# Patient Record
Sex: Female | Born: 1990 | Race: Black or African American | Hispanic: No | Marital: Married | Smoking: Former smoker
Health system: Southern US, Community
[De-identification: ages and names within clinical notes are randomized; demographics above are authoritative.]

## PROBLEM LIST (undated history)

## (undated) ENCOUNTER — Inpatient Hospital Stay (HOSPITAL_COMMUNITY): Payer: Self-pay

## (undated) DIAGNOSIS — D649 Anemia, unspecified: Secondary | ICD-10-CM

## (undated) HISTORY — PX: NO PAST SURGERIES: SHX2092

---

## 2011-03-08 ENCOUNTER — Emergency Department (HOSPITAL_COMMUNITY)
Admission: EM | Admit: 2011-03-08 | Discharge: 2011-03-08 | Disposition: A | Discharge status: Home / Self Care | Payer: Self-pay | Attending: Emergency Medicine | Admitting: Emergency Medicine

## 2011-03-08 DIAGNOSIS — K5900 Constipation, unspecified: Secondary | ICD-10-CM | POA: Insufficient documentation

## 2012-08-15 ENCOUNTER — Encounter (HOSPITAL_COMMUNITY): Payer: Self-pay | Admitting: Emergency Medicine

## 2012-08-15 ENCOUNTER — Emergency Department (INDEPENDENT_AMBULATORY_CARE_PROVIDER_SITE_OTHER)
Admission: EM | Admit: 2012-08-15 | Discharge: 2012-08-15 | Disposition: A | Discharge status: Home / Self Care | Payer: PRIVATE HEALTH INSURANCE | Source: Home / Self Care | Attending: Family Medicine | Admitting: Family Medicine

## 2012-08-15 DIAGNOSIS — N390 Urinary tract infection, site not specified: Secondary | ICD-10-CM

## 2012-08-15 LAB — POCT URINALYSIS DIP (DEVICE)
Glucose, UA: NEGATIVE mg/dL
Hgb urine dipstick: NEGATIVE
Ketones, ur: NEGATIVE mg/dL
Specific Gravity, Urine: 1.015 (ref 1.005–1.030)

## 2012-08-15 LAB — POCT PREGNANCY, URINE: Preg Test, Ur: NEGATIVE

## 2012-08-15 MED ORDER — CEPHALEXIN 500 MG PO CAPS: 500.0000 mg | ORAL_CAPSULE | Freq: Four times a day (QID) | ORAL | Status: DC

## 2012-08-15 NOTE — ED Provider Notes (Signed)
History     CSN: [id number]  Arrival date & time 08/15/12  4098   First MD Initiated Contact with Patient 08/15/12 1900      Chief Complaint  Patient presents with  . Urinary Tract Infection    urgency. frequency. blood in urine. pelvic pain and cramping.     (Consider location/radiation/quality/duration/timing/severity/associated sxs/prior treatment) Patient is a 22 y.o. female presenting with urinary tract infection. The history is provided by the patient.  Urinary Tract Infection This is a new problem. The current episode started more than 2 days ago. The problem has been gradually worsening. Pertinent negatives include no abdominal pain.    History reviewed. No pertinent past medical history.  History reviewed. No pertinent past surgical history.  History reviewed. No pertinent family history.  History  Substance Use Topics  . Smoking status: Never Smoker   . Smokeless tobacco: Not on file  . Alcohol Use: No    OB History    Grav Para Term Preterm Abortions TAB SAB Ect Mult Living                  Review of Systems  Constitutional: Negative.   Gastrointestinal: Negative.  Negative for abdominal pain.  Genitourinary: Positive for dysuria, urgency and frequency. Negative for vaginal bleeding, vaginal discharge and vaginal pain.    Allergies  Review of patient's allergies indicates no known allergies.  Home Medications   Current Outpatient Rx  Name  Route  Sig  Dispense  Refill  . CEPHALEXIN 500 MG PO CAPS   Oral   Take 1 capsule (500 mg total) by mouth 4 (four) times daily. Take all of medicine and drink lots of fluids   20 capsule   0     BP 124/65  Pulse 69  Temp 98.1 F (36.7 C) (Oral)  Resp 18  SpO2 100%  LMP 08/04/2012  Physical Exam  Nursing note and vitals reviewed. Constitutional: She is oriented to person, place, and time. She appears well-developed and well-nourished.  Abdominal: Soft. Bowel sounds are normal. She exhibits no  distension and no mass. There is no tenderness. There is no rebound and no guarding.  Neurological: She is alert and oriented to person, place, and time.  Skin: Skin is warm and dry.    ED Course  Procedures (including critical care time)  Labs Reviewed  POCT URINALYSIS DIP (DEVICE) - Abnormal; Notable for the following:    Protein, ur 30 (*)     Leukocytes, UA TRACE (*)  Biochemical Testing Only. Please order routine urinalysis from main lab if confirmatory testing is needed.   All other components within normal limits  POCT PREGNANCY, URINE   No results found.   1. UTI (lower urinary tract infection)       MDM  U/a-- pos        Linna Hoff, MD 08/15/12 2027

## 2012-08-15 NOTE — ED Notes (Signed)
Pt c/o uti symptoms. Blood in urine. Urgency. Frequency. Pelvic pain and cramping with nausea. Symptoms x 4 days.

## 2014-08-08 NOTE — L&D Delivery Note (Signed)
Delivery Note  Patient was admitted at 10569w0d for PDIOL. She was induced with FB, cytotec, pitocin and had AROM at 0640 on 06/21/15. Labor was significant for a prolonged second stage. She was complete at 1800 but began consistently pushing around 2100.   At 11:34 PM a viable and healthy female was delivered via Vaginal, Spontaneous Delivery (Presentation: Right Occiput Anterior).  APGAR: 8, 9; weight 8 lb 15 oz (4054 g).     Placenta status: Intact, Spontaneous.  Cord: 3 vessels with the following complications: Loose nuchal.  Cord pH: N/A  Anesthesia: Epidural  Episiotomy: None Lacerations: 2nd degree Suture Repair: 2.0 vicryl rapide Est. Blood Loss (mL): 200  Mom to postpartum.  Baby to Couplet care / Skin to Skin.  Jamelle HaringHillary M Fitzgerald, MD [facility], PGY-1 06/21/2015, 11:55 PM

## 2014-11-04 ENCOUNTER — Inpatient Hospital Stay (HOSPITAL_COMMUNITY)
Admission: AD | Admit: 2014-11-04 | Discharge: 2014-11-04 | Disposition: A | Discharge status: Home / Self Care | Payer: Medicaid Other | Source: Ambulatory Visit | Attending: Family Medicine | Admitting: Family Medicine

## 2014-11-04 ENCOUNTER — Encounter (HOSPITAL_COMMUNITY): Payer: Self-pay | Admitting: *Deleted

## 2014-11-04 ENCOUNTER — Inpatient Hospital Stay (HOSPITAL_COMMUNITY): Payer: PRIVATE HEALTH INSURANCE

## 2014-11-04 DIAGNOSIS — Z3A08 8 weeks gestation of pregnancy: Secondary | ICD-10-CM | POA: Insufficient documentation

## 2014-11-04 DIAGNOSIS — O26899 Other specified pregnancy related conditions, unspecified trimester: Secondary | ICD-10-CM

## 2014-11-04 DIAGNOSIS — N8320 Unspecified ovarian cysts: Secondary | ICD-10-CM | POA: Diagnosis not present

## 2014-11-04 DIAGNOSIS — O9989 Other specified diseases and conditions complicating pregnancy, childbirth and the puerperium: Secondary | ICD-10-CM | POA: Insufficient documentation

## 2014-11-04 DIAGNOSIS — O3481 Maternal care for other abnormalities of pelvic organs, first trimester: Secondary | ICD-10-CM | POA: Insufficient documentation

## 2014-11-04 DIAGNOSIS — N83209 Unspecified ovarian cyst, unspecified side: Secondary | ICD-10-CM

## 2014-11-04 DIAGNOSIS — R109 Unspecified abdominal pain: Secondary | ICD-10-CM | POA: Insufficient documentation

## 2014-11-04 HISTORY — DX: Anemia, unspecified: D64.9

## 2014-11-04 LAB — URINALYSIS, ROUTINE W REFLEX MICROSCOPIC
BILIRUBIN URINE: NEGATIVE
Glucose, UA: NEGATIVE mg/dL
HGB URINE DIPSTICK: NEGATIVE
Ketones, ur: NEGATIVE mg/dL
LEUKOCYTES UA: NEGATIVE
Nitrite: NEGATIVE
PH: 8.5 — AB (ref 5.0–8.0)
Protein, ur: NEGATIVE mg/dL
SPECIFIC GRAVITY, URINE: 1.015 (ref 1.005–1.030)
UROBILINOGEN UA: 0.2 mg/dL (ref 0.0–1.0)

## 2014-11-04 LAB — CBC
HEMATOCRIT: 35.4 % — AB (ref 36.0–46.0)
HEMOGLOBIN: 12.2 g/dL (ref 12.0–15.0)
MCH: 30.6 pg (ref 26.0–34.0)
MCHC: 34.5 g/dL (ref 30.0–36.0)
MCV: 88.7 fL (ref 78.0–100.0)
Platelets: 299 10*3/uL (ref 150–400)
RBC: 3.99 MIL/uL (ref 3.87–5.11)
RDW: 12.3 % (ref 11.5–15.5)
WBC: 6 10*3/uL (ref 4.0–10.5)

## 2014-11-04 LAB — WET PREP, GENITAL
Trich, Wet Prep: NONE SEEN
YEAST WET PREP: NONE SEEN

## 2014-11-04 LAB — ABO/RH: ABO/RH(D): O POS

## 2014-11-04 LAB — HCG, QUANTITATIVE, PREGNANCY: HCG, BETA CHAIN, QUANT, S: 71782 m[IU]/mL — AB (ref <5)

## 2014-11-04 LAB — POCT PREGNANCY, URINE: PREG TEST UR: POSITIVE — AB

## 2014-11-04 MED ORDER — CONCEPT OB 130-92.4-1 MG PO CAPS: 1.0000 | ORAL_CAPSULE | Freq: Every day | ORAL | Status: AC

## 2014-11-04 NOTE — MAU Note (Signed)
C/o cramping since 2100 last night; denies bleeding; positive UPT today in MAU;

## 2014-11-04 NOTE — MAU Note (Signed)
C/o abdominal pain yesterday but not today; denies any bleeding or vaginal leaking; hx of TAB;

## 2014-11-04 NOTE — Discharge Instructions (Signed)
Ovarian Cyst An ovarian cyst is a fluid-filled sac that forms on an ovary. The ovaries are small organs that produce eggs in women. Various types of cysts can form on the ovaries. Most are not cancerous. Many do not cause problems, and they often go away on their own. Some may cause symptoms and require treatment. Common types of ovarian cysts include:  Functional cysts--These cysts may occur every month during the menstrual cycle. This is normal. The cysts usually go away with the next menstrual cycle if the woman does not get pregnant. Usually, there are no symptoms with a functional cyst.  Endometrioma cysts--These cysts form from the tissue that lines the uterus. They are also called "chocolate cysts" because they become filled with blood that turns brown. This type of cyst can cause pain in the lower abdomen during intercourse and with your menstrual period.  Cystadenoma cysts--This type develops from the cells on the outside of the ovary. These cysts can get very big and cause lower abdomen pain and pain with intercourse. This type of cyst can twist on itself, cut off its blood supply, and cause severe pain. It can also easily rupture and cause a lot of pain.  Dermoid cysts--This type of cyst is sometimes found in both ovaries. These cysts may contain different kinds of body tissue, such as skin, teeth, hair, or cartilage. They usually do not cause symptoms unless they get very big.  Theca lutein cysts--These cysts occur when too much of a certain hormone (human chorionic gonadotropin) is produced and overstimulates the ovaries to produce an egg. This is most common after procedures used to assist with the conception of a baby (in vitro fertilization). CAUSES   Fertility drugs can cause a condition in which multiple large cysts are formed on the ovaries. This is called ovarian hyperstimulation syndrome.  A condition called polycystic ovary syndrome can cause hormonal imbalances that can lead to  nonfunctional ovarian cysts. SIGNS AND SYMPTOMS  Many ovarian cysts do not cause symptoms. If symptoms are present, they may include:  Pelvic pain or pressure.  Pain in the lower abdomen.  Pain during sexual intercourse.  Increasing girth (swelling) of the abdomen.  Abnormal menstrual periods.  Increasing pain with menstrual periods.  Stopping having menstrual periods without being pregnant. DIAGNOSIS  These cysts are commonly found during a routine or annual pelvic exam. Tests may be ordered to find out more about the cyst. These tests may include:  Ultrasound.  X-ray of the pelvis.  CT scan.  MRI.  Blood tests. TREATMENT  Many ovarian cysts go away on their own without treatment. Your health care provider may want to check your cyst regularly for 2-3 months to see if it changes. For women in menopause, it is particularly important to monitor a cyst closely because of the higher rate of ovarian cancer in menopausal women. When treatment is needed, it may include any of the following:  A procedure to drain the cyst (aspiration). This may be done using a long needle and ultrasound. It can also be done through a laparoscopic procedure. This involves using a thin, lighted tube with a tiny camera on the end (laparoscope) inserted through a small incision.  Surgery to remove the whole cyst. This may be done using laparoscopic surgery or an open surgery involving a larger incision in the lower abdomen.  Hormone treatment or birth control pills. These methods are sometimes used to help dissolve a cyst. HOME CARE INSTRUCTIONS   Only take over-the-counter  or prescription medicines as directed by your health care provider.  Follow up with your health care provider as directed.  Get regular pelvic exams and Pap tests. SEEK MEDICAL CARE IF:   Your periods are late, irregular, or painful, or they stop.  Your pelvic pain or abdominal pain does not go away.  Your abdomen becomes  larger or swollen.  You have pressure on your bladder or trouble emptying your bladder completely.  You have pain during sexual intercourse.  You have feelings of fullness, pressure, or discomfort in your stomach.  You lose weight for no apparent reason.  You feel generally ill.  You become constipated.  You lose your appetite.  You develop acne.  You have an increase in body and facial hair.  You are gaining weight, without changing your exercise and eating habits.  You think you are pregnant. SEEK IMMEDIATE MEDICAL CARE IF:   You have increasing abdominal pain. This may be a sign of ovarian torsion (twisting of the ovary).  You feel sick to your stomach (nauseous), and you throw up (vomit).  You develop a fever that comes on suddenly.  You have abdominal pain during a bowel movement.  Your menstrual periods become heavier than usual. MAKE SURE YOU:  Understand these instructions.  Will watch your condition.  Will get help right away if you are not doing well or get worse. Document Released: 07/25/2005 Document Revised: 07/30/2013 Document Reviewed: 04/01/2013 [facility] Patient Information 2015 [place], [place]. This information is not intended to replace advice given to you by your health care provider. Make sure you discuss any questions you have with your health care provider.   Ovarian Torsion The ovaries are female reproductive organs that produce eggs. Ovarian torsion is when an ovary becomes twisted and cuts off its own blood supply. This can occur at any age. If an ovary is twisted, it cannot get blood and the ovary swells. It is a painful medical emergency. It must be treated quickly. If too much time has passed, blood flow to the ovary may not be restored and the ovary may have to be removed. CAUSES Torsion can happen in an ovary that is normal size. However, most of the time it occurs in an ovary that is enlarged. An ovary can become enlarged because  of:  Harmless (benign) tumors on the ovaries.  Cancerous tumors.  Ovarian cysts, which are fluid-filled sacs.  Normal pregnancy.  A pregnancy that occurs outside the uterus (ectopic pregnancy). RISK FACTORS Risk factors are things that increase the likelihood of this condition happening. The risk factors include:  Having fallopian tubes that are longer than normal.  Having ovaries that are larger than normal.  Taking fertility medicine to become pregnant.  Having had surgery in the pelvic area. SYMPTOMS  Sudden pain in the lower abdomen, usually on one side only.  Pelvic pain that starts after exercise.  Pelvic pain that gets worse over time.  Severe pelvic pain that comes and goes.  Pelvic pain that spreads into the lower back or thigh.  Nausea and vomiting along with pelvic pain. DIAGNOSIS Your caregiver will take a history and perform a physical exam. He or she may be able to feel an enlarged ovary. Your caregiver may order some further tests, which include:  A pregnancy test.  Imaging tests, such as pelvic Doppler ultrasonography, that measures blood flow, CT scan, or MRI. Your caregiver may also perform a diagnostic laparoscopic exam. A small surgical cut (incision) will be made  in your abdomen. Then, a small lighted telescope is put through the opening. This allows your caregiver to clearly see your ovary and fallopian tube.  TREATMENT Surgery is needed when an ovary becomes twisted. It is best to do this 8 hours or less after the ovary becomes twisted. Laparoscopic ovarian torsion surgery is done to try to untwist the ovary. Sometimes, a large incision has to be made in the abdomen (laparotomy) to relieve the ovary. If the ovary cannot be untwisted, the ovary will have to be surgically removed during a procedure called salpingo-oophorectomy. Document Released: 07/14/2011 Document Revised: 12/09/2013 Document Reviewed: 07/14/2011 [facility] Patient Information 2015  [place], [place]. This information is not intended to replace advice given to you by your health care provider. Make sure you discuss any questions you have with your health care provider.  First Trimester of Pregnancy The first trimester of pregnancy is from week 1 until the end of week 12 (months 1 through 3). A week after a sperm fertilizes an egg, the egg will implant on the wall of the uterus. This embryo will begin to develop into a baby. Genes from you and your partner are forming the baby. The female genes determine whether the baby is a boy or a girl. At 6-8 weeks, the eyes and face are formed, and the heartbeat can be seen on ultrasound. At the end of 12 weeks, all the baby's organs are formed.  Now that you are pregnant, you will want to do everything you can to have a healthy baby. Two of the most important things are to get good prenatal care and to follow your health care provider's instructions. Prenatal care is all the medical care you receive before the baby's birth. This care will help prevent, find, and treat any problems during the pregnancy and childbirth. BODY CHANGES Your body goes through many changes during pregnancy. The changes vary from woman to woman.   You may gain or lose a couple of pounds at first.  You may feel sick to your stomach (nauseous) and throw up (vomit). If the vomiting is uncontrollable, call your health care provider.  You may tire easily.  You may develop headaches that can be relieved by medicines approved by your health care provider.  You may urinate more often. Painful urination may mean you have a bladder infection.  You may develop heartburn as a result of your pregnancy.  You may develop constipation because certain hormones are causing the muscles that push waste through your intestines to slow down.  You may develop hemorrhoids or swollen, bulging veins (varicose veins).  Your breasts may begin to grow larger and become tender. Your nipples  may stick out more, and the tissue that surrounds them (areola) may become darker.  Your gums may bleed and may be sensitive to brushing and flossing.  Dark spots or blotches (chloasma, mask of pregnancy) may develop on your face. This will likely fade after the baby is born.  Your menstrual periods will stop.  You may have a loss of appetite.  You may develop cravings for certain kinds of food.  You may have changes in your emotions from day to day, such as being excited to be pregnant or being concerned that something may go wrong with the pregnancy and baby.  You may have more vivid and strange dreams.  You may have changes in your hair. These can include thickening of your hair, rapid growth, and changes in texture. Some women also have hair  loss during or after pregnancy, or hair that feels dry or thin. Your hair will most likely return to normal after your baby is born. WHAT TO EXPECT AT YOUR PRENATAL VISITS During a routine prenatal visit:  You will be weighed to make sure you and the baby are growing normally.  Your blood pressure will be taken.  Your abdomen will be measured to track your baby's growth.  The fetal heartbeat will be listened to starting around week 10 or 12 of your pregnancy.  Test results from any previous visits will be discussed. Your health care provider may ask you:  How you are feeling.  If you are feeling the baby move.  If you have had any abnormal symptoms, such as leaking fluid, bleeding, severe headaches, or abdominal cramping.  If you have any questions. Other tests that may be performed during your first trimester include:  Blood tests to find your blood type and to check for the presence of any previous infections. They will also be used to check for low iron levels (anemia) and Rh antibodies. Later in the pregnancy, blood tests for diabetes will be done along with other tests if problems develop.  Urine tests to check for infections,  diabetes, or protein in the urine.  An ultrasound to confirm the proper growth and development of the baby.  An amniocentesis to check for possible genetic problems.  Fetal screens for spina bifida and Down syndrome.  You may need other tests to make sure you and the baby are doing well. HOME CARE INSTRUCTIONS  Medicines  Follow your health care provider's instructions regarding medicine use. Specific medicines may be either safe or unsafe to take during pregnancy.  Take your prenatal vitamins as directed.  If you develop constipation, try taking a stool softener if your health care provider approves. Diet  Eat regular, well-balanced meals. Choose a variety of foods, such as meat or vegetable-based protein, fish, milk and low-fat dairy products, vegetables, fruits, and whole grain breads and cereals. Your health care provider will help you determine the amount of weight gain that is right for you.  Avoid raw meat and uncooked cheese. These carry germs that can cause birth defects in the baby.  Eating four or five small meals rather than three large meals a day may help relieve nausea and vomiting. If you start to feel nauseous, eating a few soda crackers can be helpful. Drinking liquids between meals instead of during meals also seems to help nausea and vomiting.  If you develop constipation, eat more high-fiber foods, such as fresh vegetables or fruit and whole grains. Drink enough fluids to keep your urine clear or pale yellow. Activity and Exercise  Exercise only as directed by your health care provider. Exercising will help you:  Control your weight.  Stay in shape.  Be prepared for labor and delivery.  Experiencing pain or cramping in the lower abdomen or low back is a good sign that you should stop exercising. Check with your health care provider before continuing normal exercises.  Try to avoid standing for long periods of time. Move your legs often if you must stand in  one place for a long time.  Avoid heavy lifting.  Wear low-heeled shoes, and practice good posture.  You may continue to have sex unless your health care provider directs you otherwise. Relief of Pain or Discomfort  Wear a good support bra for breast tenderness.   Take warm sitz baths to soothe any pain or  discomfort caused by hemorrhoids. Use hemorrhoid cream if your health care provider approves.   Rest with your legs elevated if you have leg cramps or low back pain.  If you develop varicose veins in your legs, wear support hose. Elevate your feet for 15 minutes, 3-4 times a day. Limit salt in your diet. Prenatal Care  Schedule your prenatal visits by the twelfth week of pregnancy. They are usually scheduled monthly at first, then more often in the last 2 months before delivery.  Write down your questions. Take them to your prenatal visits.  Keep all your prenatal visits as directed by your health care provider. Safety  Wear your seat belt at all times when driving.  Make a list of emergency phone numbers, including numbers for family, friends, the hospital, and police and fire departments. General Tips  Ask your health care provider for a referral to a local prenatal education class. Begin classes no later than at the beginning of month 6 of your pregnancy.  Ask for help if you have counseling or nutritional needs during pregnancy. Your health care provider can offer advice or refer you to specialists for help with various needs.  Do not use hot tubs, steam rooms, or saunas.  Do not douche or use tampons or scented sanitary pads.  Do not cross your legs for long periods of time.  Avoid cat litter boxes and soil used by cats. These carry germs that can cause birth defects in the baby and possibly loss of the fetus by miscarriage or stillbirth.  Avoid all smoking, herbs, alcohol, and medicines not prescribed by your health care provider. Chemicals in these affect the  formation and growth of the baby.  Schedule a dentist appointment. At home, brush your teeth with a soft toothbrush and be gentle when you floss. SEEK MEDICAL CARE IF:   You have dizziness.  You have mild pelvic cramps, pelvic pressure, or nagging pain in the abdominal area.  You have persistent nausea, vomiting, or diarrhea.  You have a bad smelling vaginal discharge.  You have pain with urination.  You notice increased swelling in your face, hands, legs, or ankles. SEEK IMMEDIATE MEDICAL CARE IF:   You have a fever.  You are leaking fluid from your vagina.  You have spotting or bleeding from your vagina.  You have severe abdominal cramping or pain.  You have rapid weight gain or loss.  You vomit blood or material that looks like coffee grounds.  You are exposed to [place] measles and have never had them.  You are exposed to fifth disease or chickenpox.  You develop a severe headache.  You have shortness of breath.  You have any kind of trauma, such as from a fall or a car accident. Document Released: 07/19/2001 Document Revised: 12/09/2013 Document Reviewed: 06/04/2013 [facility] Patient Information 2015 [place], [place]. This information is not intended to replace advice given to you by your health care provider. Make sure you discuss any questions you have with your health care provider.

## 2014-11-04 NOTE — MAU Note (Signed)
Having abd cramps since last night. No bleeding or d/c. No cramping today.

## 2014-11-04 NOTE — MAU Provider Note (Deleted)
Chief Complaint: Abdominal Cramping   None    SUBJECTIVE HPI: Debra Mccarty is a 24 y.o. G2P0010 at 572w4d by LMP who presents to Maternity Admissions reporting   C/o cramping since 2100 last night; denies bleeding; positive UPT today in MAU  Past Medical History  Diagnosis Date  . Anemia    OB History  Gravida Para Term Preterm AB SAB TAB Ectopic Multiple Living  2    1  1        # Outcome Date GA Lbr Len/2nd Weight Sex Delivery Anes PTL Lv  2 Current           1 TAB              Past Surgical History  Procedure Laterality Date  . No past surgeries     History   Social History  . Marital Status: Single    Spouse Name: N/A  . Number of Children: N/A  . Years of Education: N/A   Occupational History  . Not on file.   Social History Main Topics  . Smoking status: Never Smoker   . Smokeless tobacco: Not on file  . Alcohol Use: No  . Drug Use: No  . Sexual Activity: Yes    Birth Control/ Protection: None   Other Topics Concern  . Not on file   Social History Narrative   No current facility-administered medications on file prior to encounter.   Current Outpatient Prescriptions on File Prior to Encounter  Medication Sig Dispense Refill  . cephALEXin (KEFLEX) 500 MG capsule Take 1 capsule (500 mg total) by mouth 4 (four) times daily. Take all of medicine and drink lots of fluids 20 capsule 0   No Known Allergies  ROS  OBJECTIVE Blood pressure 132/61, pulse 82, temperature 98.7 F (37.1 C), resp. rate 18, height 5\' 8"  (1.727 m), weight 178 lb 9.6 oz (81.012 kg), last menstrual period 09/05/2014, SpO2 100 %. GENERAL: Well-developed, well-nourished female in no acute distress.  HEART: normal rate RESP: normal effort GI: Abdomen soft, non-tender. Positive bowel sounds 4. MS: Nontender, no edema NEURO: Alert and oriented SPECULUM EXAM: NEFG, physiologic discharge, no blood noted, cervix clean BIMANUAL: cervix    ; uterus normal size, no adnexal tenderness or  masses  LAB RESULTS Results for orders placed or performed during the hospital encounter of 11/04/14 (from the past 24 hour(s))  Pregnancy, urine POC     Status: Abnormal   Collection Time: 11/04/14 12:59 PM  Result Value Ref Range   Preg Test, Ur POSITIVE (A) NEGATIVE    IMAGING No results found.  MAU COURSE  ASSESSMENT No diagnosis found.  PLAN Discharge home in stable condition.    Medication List    ASK your doctor about these medications        cephALEXin 500 MG capsule  Commonly known as:  KEFLEX  Take 1 capsule (500 mg total) by mouth 4 (four) times daily. Take all of medicine and drink lots of fluids         [place] Robey Massmann, [place] 11/04/2014  1:11 PM

## 2014-11-04 NOTE — MAU Provider Note (Signed)
History     CSN: [id number]  Arrival date and time: 11/04/14 1228   First Provider Initiated Contact with Patient 11/04/14 1315      Chief Complaint  Patient presents with  . Abdominal Cramping   HPI  Debra Mccarty is a 24 y.o.BF G2P0010, [redacted]w[redacted]d, with history of elective abortion 1 year ago. Pt states she had some pelvic cramping last night that lasted for about an hour. The cramping began as a severe 10/10 pain and alleviated over time. She did not try any treatment. She experienced cramping 3 weeks ago that only lasted for a few minutes, but prompted her to get an OTC UPT which was positive. LMP was 09/05/14. She is currently asymptomatic but presents because she is concerned that the cramping is abnormal. No VB, vaginal d/c or LOF. She has not had any prenatal care yet.  OB History    Gravida Para Term Preterm AB TAB SAB Ectopic Multiple Living   2    1 1           Past Medical History  Diagnosis Date  . Anemia     Past Surgical History  Procedure Laterality Date  . No past surgeries      Family History  Problem Relation Age of Onset  . Hypertension Maternal Grandmother   . Cancer Maternal Grandmother   . Arthritis Paternal Grandmother     History  Substance Use Topics  . Smoking status: Never Smoker   . Smokeless tobacco: Not on file  . Alcohol Use: No    Allergies: No Known Allergies  Prescriptions prior to admission  Medication Sig Dispense Refill Last Dose  . cephALEXin (KEFLEX) 500 MG capsule Take 1 capsule (500 mg total) by mouth 4 (four) times daily. Take all of medicine and drink lots of fluids 20 capsule 0     Review of Systems  Constitutional: Negative for fever, chills, weight loss and malaise/fatigue.  HENT: Negative.   Eyes: Negative.   Respiratory: Negative for cough, shortness of breath and wheezing.   Cardiovascular: Negative for chest pain and palpitations.  Gastrointestinal: Positive for nausea. Negative for vomiting, abdominal pain,  diarrhea, constipation and blood in stool.       Occasional nausea in the mornings.  Genitourinary: Negative for dysuria, urgency, frequency and hematuria.  Musculoskeletal: Negative.   Skin: Negative.   Neurological: Negative.  Negative for dizziness, tingling, loss of consciousness, weakness and headaches.  Endo/Heme/Allergies: Negative.   Psychiatric/Behavioral: Negative.    Physical Exam   Blood pressure 132/61, pulse 82, temperature 98.7 F (37.1 C), resp. rate 18, height 5\' 8"  (1.727 m), weight 81.012 kg (178 lb 9.6 oz), last menstrual period 09/05/2014, SpO2 100 %.  Physical Exam  Nursing note and vitals reviewed. Constitutional: She is oriented to person, place, and time. She appears well-developed and well-nourished. No distress.  HENT:  Head: Normocephalic and atraumatic.  Eyes: Conjunctivae and EOM are normal. Pupils are equal, round, and reactive to light.  Neck: Normal range of motion. Neck supple.  Cardiovascular: Normal rate, regular rhythm, normal heart sounds and intact distal pulses.   Respiratory: Effort normal and breath sounds normal.  GI: Soft. Bowel sounds are normal. She exhibits no distension and no mass. There is no tenderness. There is no rebound and no guarding. Hernia confirmed negative in the right inguinal area and confirmed negative in the left inguinal area.  Genitourinary: There is no rash, tenderness, lesion or injury on the right labia. There is no rash, tenderness,  lesion or injury on the left labia. Uterus is enlarged. Cervix exhibits no motion tenderness. Right adnexum displays no mass, no tenderness and no fullness. Left adnexum displays no mass, no tenderness and no fullness. No erythema, tenderness or bleeding in the vagina. No foreign body around the vagina. No signs of injury around the vagina. Vaginal discharge found.  Moderate thick white vaginal d/c Cervix is open and mildly erythematous  Musculoskeletal: Normal range of motion. She exhibits  no edema or tenderness.  Lymphadenopathy:    She has no cervical adenopathy.  Neurological: She is alert and oriented to person, place, and time.  Skin: Skin is warm and dry. She is not diaphoretic.  Psychiatric: She has a normal mood and affect. Her behavior is normal.  Bimanual exam: Cervix closed. No CMT. No adnexal tenderness.   Results for orders placed or performed during the hospital encounter of 11/04/14 (from the past 24 hour(s))  Urinalysis, Routine w reflex microscopic     Status: Abnormal   Collection Time: 11/04/14 12:45 PM  Result Value Ref Range   Color, Urine YELLOW YELLOW   APPearance CLEAR CLEAR   Specific Gravity, Urine 1.015 1.005 - 1.030   pH 8.5 (H) 5.0 - 8.0   Glucose, UA NEGATIVE NEGATIVE mg/dL   Hgb urine dipstick NEGATIVE NEGATIVE   Bilirubin Urine NEGATIVE NEGATIVE   Ketones, ur NEGATIVE NEGATIVE mg/dL   Protein, ur NEGATIVE NEGATIVE mg/dL   Urobilinogen, UA 0.2 0.0 - 1.0 mg/dL   Nitrite NEGATIVE NEGATIVE   Leukocytes, UA NEGATIVE NEGATIVE  Pregnancy, urine POC     Status: Abnormal   Collection Time: 11/04/14 12:59 PM  Result Value Ref Range   Preg Test, Ur POSITIVE (A) NEGATIVE  Wet prep, genital     Status: Abnormal   Collection Time: 11/04/14  1:50 PM  Result Value Ref Range   Yeast Wet Prep HPF POC NONE SEEN NONE SEEN   Trich, Wet Prep NONE SEEN NONE SEEN   Clue Cells Wet Prep HPF POC FEW (A) NONE SEEN   WBC, Wet Prep HPF POC FEW (A) NONE SEEN  ABO/Rh     Status: None   Collection Time: 11/04/14  2:03 PM  Result Value Ref Range   ABO/RH(D) O POS   CBC     Status: Abnormal   Collection Time: 11/04/14  2:03 PM  Result Value Ref Range   WBC 6.0 4.0 - 10.5 K/uL   RBC 3.99 3.87 - 5.11 MIL/uL   Hemoglobin 12.2 12.0 - 15.0 g/dL   HCT 08.6 (L) 57.8 - 46.9 %   MCV 88.7 78.0 - 100.0 fL   MCH 30.6 26.0 - 34.0 pg   MCHC 34.5 30.0 - 36.0 g/dL   RDW 62.9 52.8 - 41.3 %   Platelets 299 150 - 400 K/uL  hCG, quantitative, pregnancy     Status: Abnormal    Collection Time: 11/04/14  2:03 PM  Result Value Ref Range   hCG, Beta Chain, Quant, S 71782 (H) <5 mIU/mL   IMAGING US Ob Comp Less 14 Wks  11/04/2014   CLINICAL DATA:  Patient with abdominal cramping. Unsure of last menstrual period.  EXAM: OBSTETRIC <14 WK [place] AND TRANSVAGINAL OB US  TECHNIQUE: Both transabdominal and transvaginal ultrasound examinations were performed for complete evaluation of the gestation as well as the maternal uterus, adnexal regions, and pelvic cul-de-sac. Transvaginal technique was performed to assess early pregnancy.  COMPARISON:  None.  FINDINGS: Intrauterine gestational sac: Visualized/normal in shape.  Yolk sac:  Present  Embryo:  Present  Cardiac Activity: Present  Heart Rate: 156  bpm  CRL:  16.8  mm   8 w   1 d                  US EDC: 06/15/2015  Maternal uterus/adnexae: The right ovary is normal. The left ovary is enlarged measuring 7.4 x 5.9 x 7.3 cm. Multiple thin internal septations versus retracting clot demonstrated within the left ovarian cyst which measures 7.7 x 6.9 x 3.4 cm. Moderate subchorionic hemorrhage. Trace free fluid in the pelvis.  IMPRESSION: Single live intrauterine gestation 8 weeks 4 days by LMP.  Large complicated cystic mass within the left ovary with multiple internal echoes/ thin septations, favored to represent a hemorrhagic cyst. Short-term sonographic follow-up in 6-8 weeks is recommended to evaluate for interval change/resolution.  While felt to be less likely, given the size of the left ovary, intermittent torsion cannot be excluded as a cause of pain.  Moderate subchorionic hemorrhage.  These results will be called to the ordering clinician or representative by the Radiologist Assistant, and communication documented in the PACS or zVision Dashboard.   Electronically Signed   By: Annia Beltrew  Davis M.D.   On: 11/04/2014 15:22   [place] Ob Transvaginal  11/04/2014   CLINICAL DATA:  Patient with abdominal cramping. Unsure of last menstrual period.   EXAM: OBSTETRIC <14 WK US AND TRANSVAGINAL OB US  TECHNIQUE: Both transabdominal and transvaginal ultrasound examinations were performed for complete evaluation of the gestation as well as the maternal uterus, adnexal regions, and pelvic cul-de-sac. Transvaginal technique was performed to assess early pregnancy.  COMPARISON:  None.  FINDINGS: Intrauterine gestational sac: Visualized/normal in shape.  Yolk sac:  Present  Embryo:  Present  Cardiac Activity: Present  Heart Rate: 156  bpm  CRL:  16.8  mm   8 w   1 d                  US EDC: 06/15/2015  Maternal uterus/adnexae: The right ovary is normal. The left ovary is enlarged measuring 7.4 x 5.9 x 7.3 cm. Multiple thin internal septations versus retracting clot demonstrated within the left ovarian cyst which measures 7.7 x 6.9 x 3.4 cm. Moderate subchorionic hemorrhage. Trace free fluid in the pelvis.  IMPRESSION: Single live intrauterine gestation 8 weeks 4 days by LMP.  Large complicated cystic mass within the left ovary with multiple internal echoes/ thin septations, favored to represent a hemorrhagic cyst. Short-term sonographic follow-up in 6-8 weeks is recommended to evaluate for interval change/resolution.  While felt to be less likely, given the size of the left ovary, intermittent torsion cannot be excluded as a cause of pain.  Moderate subchorionic hemorrhage.  These results will be called to the ordering clinician or representative by the Radiologist Assistant, and communication documented in the PACS or zVision Dashboard.   Electronically Signed   By: Annia Beltrew  Davis M.D.   On: 11/04/2014 15:22     MAU Course  Procedures  MDM  CBC: UA: WNL UPT+ GC/C Wet prep Transvaginal US: left hemorrhagic cyst  Assessment and Plan   1. Hemorrhagic cyst of ovary   2. Abdominal pain affecting pregnancy, antepartum   3. Abdominal pain affecting pregnancy, antepartum   4. Ovarian cyst affecting pregnancy in first trimester, antepartum    PLAN D/C home  in stable condition. First trimester precautions. Torsion precautions. List of providers given. Pregnancy verification letter given.  Repeat  US in 6-8 weeks Follow-up Information    Follow up with Obstetrician of your choice.   Why:  Start prenatal care      Follow up with [facility].   Why:  As needed in emergencies   Contact information:   [address] [id number] mc [place] [address] [phone]       Medication List    STOP taking these medications        cephALEXin 500 MG capsule  Commonly known as:  KEFLEX      TAKE these medications        CONCEPT OB 130-92.4-1 MG Caps  Take 1 tablet by mouth daily.       [place], [place] 11/04/2014 10:18 PM

## 2014-11-05 LAB — GC/CHLAMYDIA PROBE AMP (~~LOC~~) NOT AT ARMC
CHLAMYDIA, DNA PROBE: NEGATIVE
Neisseria Gonorrhea: NEGATIVE

## 2014-11-05 LAB — HIV ANTIBODY (ROUTINE TESTING W REFLEX): HIV SCREEN 4TH GENERATION: NONREACTIVE

## 2014-11-12 ENCOUNTER — Encounter: Payer: PRIVATE HEALTH INSURANCE | Admitting: Physician Assistant

## 2014-11-12 DIAGNOSIS — Z3490 Encounter for supervision of normal pregnancy, unspecified, unspecified trimester: Secondary | ICD-10-CM

## 2014-11-25 ENCOUNTER — Emergency Department (HOSPITAL_COMMUNITY)
Admission: EM | Admit: 2014-11-25 | Discharge: 2014-11-25 | Disposition: A | Discharge status: Home / Self Care | Payer: Medicaid Other | Attending: Emergency Medicine | Admitting: Emergency Medicine

## 2014-11-25 DIAGNOSIS — Z0000 Encounter for general adult medical examination without abnormal findings: Secondary | ICD-10-CM

## 2014-11-25 DIAGNOSIS — Y92410 Unspecified street and highway as the place of occurrence of the external cause: Secondary | ICD-10-CM | POA: Insufficient documentation

## 2014-11-25 DIAGNOSIS — Z862 Personal history of diseases of the blood and blood-forming organs and certain disorders involving the immune mechanism: Secondary | ICD-10-CM | POA: Diagnosis not present

## 2014-11-25 DIAGNOSIS — O9A211 Injury, poisoning and certain other consequences of external causes complicating pregnancy, first trimester: Secondary | ICD-10-CM | POA: Diagnosis present

## 2014-11-25 DIAGNOSIS — Y9389 Activity, other specified: Secondary | ICD-10-CM | POA: Insufficient documentation

## 2014-11-25 DIAGNOSIS — Z041 Encounter for examination and observation following transport accident: Secondary | ICD-10-CM | POA: Insufficient documentation

## 2014-11-25 DIAGNOSIS — Z3A11 11 weeks gestation of pregnancy: Secondary | ICD-10-CM | POA: Insufficient documentation

## 2014-11-25 DIAGNOSIS — Z3490 Encounter for supervision of normal pregnancy, unspecified, unspecified trimester: Secondary | ICD-10-CM

## 2014-11-25 DIAGNOSIS — Y998 Other external cause status: Secondary | ICD-10-CM | POA: Insufficient documentation

## 2014-11-25 NOTE — Discharge Instructions (Signed)
Prenatal Care  WHAT IS PRENATAL CARE?  Prenatal care means health care during your pregnancy, before your baby is born. It is very important to take care of yourself and your baby during your pregnancy by:   Getting early prenatal care. If you know you are pregnant, or think you might be pregnant, call your health care provider as soon as possible. Schedule a visit for a prenatal exam.  Getting regular prenatal care. Follow your health care provider's schedule for blood and other necessary tests. Do not miss appointments.  Doing everything you can to keep yourself and your baby healthy during your pregnancy.  Getting complete care. Prenatal care should include evaluation of the medical, dietary, educational, psychological, and social needs of you and your significant other. The medical and genetic history of your family and the family of your baby's father should be discussed with your health care provider.  Discussing with your health care provider:  Prescription, over-the-counter, and herbal medicines that you take.  Any history of substance abuse, alcohol use, smoking, and illegal drug use.  Any history of domestic abuse and violence.  Immunizations you have received.  Your nutrition and diet.  The amount of exercise you do.  Any environmental and occupational hazards to which you are exposed.  History of sexually transmitted infections for both you and your partner.  Previous pregnancies you have had. WHY IS PRENATAL CARE SO IMPORTANT?  By regularly seeing your health care provider, you help ensure that problems can be identified early so that they can be treated as soon as possible. Other problems might be prevented. Many studies have shown that early and regular prenatal care is important for the health of mothers and their babies.  HOW CAN I TAKE CARE OF MYSELF WHILE I AM PREGNANT?  Here are ways to take care of yourself and your baby:   Start or continue taking your  multivitamin with 400 micrograms (mcg) of folic acid every day.  Get early and regular prenatal care. It is very important to see a health care provider during your pregnancy. Your health care provider will check at each visit to make sure that you and your baby are healthy. If there are any problems, action can be taken right away to help you and your baby.  Eat a healthy diet that includes:  Fruits.  Vegetables.  Foods low in saturated fat.  Whole grains.  Calcium-rich foods, such as milk, yogurt, and hard cheeses.  Drink 6-8 glasses of liquids a day.  Unless your health care provider tells you not to, try to be physically active for 30 minutes, most days of the week. If you are pressed for time, you can get your activity in through 10-minute segments, three times a day.  Do not smoke, drink alcohol, or use drugs. These can cause long-term damage to your baby. Talk with your health care provider about steps to take to stop smoking. Talk with a member of your faith community, a counselor, a trusted friend, or your health care provider if you are concerned about your alcohol or drug use.  Ask your health care provider before taking any medicine, even over-the-counter medicines. Some medicines are not safe to take during pregnancy.  Get plenty of rest and sleep.  Avoid hot tubs and saunas during pregnancy.  Do not have X-rays taken unless absolutely necessary and with the recommendation of your health care provider. A lead shield can be placed on your abdomen to protect your baby when   X-rays are taken in other parts of your body.  Do not empty the cat litter when you are pregnant. It may contain a parasite that causes an infection called toxoplasmosis, which can cause birth defects. Also, use gloves when working in garden areas used by cats.  Do not eat uncooked or undercooked meats or fish.  Do not eat soft, mold-ripened cheeses (Brie, Camembert, and chevre) or soft, blue-veined  cheese (Danish blue and Roquefort).  Stay away from toxic chemicals like:  Insecticides.  Solvents (some cleaners or paint thinners).  Lead.  Mercury.  Sexual intercourse may continue until the end of the pregnancy, unless you have a medical problem or there is a problem with the pregnancy and your health care provider tells you not to.  Do not wear high-heel shoes, especially during the second half of the pregnancy. You can lose your balance and fall.  Do not take long trips, unless absolutely necessary. Be sure to see your health care provider before going on the trip.  Do not sit in one position for more than 2 hours when on a trip.  Take a copy of your medical records when going on a trip. Know where a hospital is located in the city you are visiting, in case of an emergency.  Most dangerous household products will have pregnancy warnings on their labels. Ask your health care provider about products if you are unsure.  Limit or eliminate your caffeine intake from coffee, tea, sodas, medicines, and chocolate.  Many women continue working through pregnancy. Staying active might help you stay healthier. If you have a question about the safety or the hours you work at your particular job, talk with your health care provider.  Get informed:  Read books.  Watch videos.  Go to childbirth classes for you and your significant other.  Talk with experienced moms.  Ask your health care provider about childbirth education classes for you and your partner. Classes can help you and your partner prepare for the birth of your baby.  Ask about a baby doctor (pediatrician) and methods and pain medicine for labor, delivery, and possible cesarean delivery. HOW OFTEN SHOULD I SEE MY HEALTH CARE PROVIDER DURING PREGNANCY?  Your health care provider will give you a schedule for your prenatal visits. You will have visits more often as you get closer to the end of your pregnancy. An average  pregnancy lasts about 40 weeks.  A typical schedule includes visiting your health care provider:   About once each month during your first 6 months of pregnancy.  Every 2 weeks during the next 2 months.  Weekly in the last month, until the delivery date. Your health care provider will probably want to see you more often if:  You are older than 35 years.  Your pregnancy is high risk because you have certain health problems or problems with the pregnancy, such as:  Diabetes.  High blood pressure.  The baby is not growing on schedule, according to the dates of the pregnancy. Your health care provider will do special tests to make sure you and your baby are not having any serious problems. WHAT HAPPENS DURING PRENATAL VISITS?   At your first prenatal visit, your health care provider will do a physical exam and talk to you about your health history and the health history of your partner and your family. Your health care provider will be able to tell you what date to expect your baby to be born on.  Your   first physical exam will include checks of your blood pressure, measurements of your height and weight, and an exam of your pelvic organs. Your health care provider will do a Pap test if you have not had one recently and will do cultures of your cervix to make sure there is no infection.  At each prenatal visit, there will be tests of your blood, urine, blood pressure, weight, and the progress of the baby will be checked.  At your later prenatal visits, your health care provider will check how you are doing and how your baby is developing. You may have a number of tests done as your pregnancy progresses.  Ultrasound exams are often used to check on your baby's growth and health.  You may have more urine and blood tests, as well as special tests, if needed. These may include amniocentesis to examine fluid in the pregnancy sac, stress tests to check how the baby responds to contractions, or a  biophysical profile to measure your baby's well-being. Your health care provider will explain the tests and why they are necessary.  You should be tested for high blood sugar (gestational diabetes) between the 24th and 28th weeks of your pregnancy.  You should discuss with your health care provider your plans to breastfeed or bottle-feed your baby.  Each visit is also a chance for you to learn about staying healthy during pregnancy and to ask questions. Document Released: 07/28/2003 Document Revised: 07/30/2013 Document Reviewed: 10/09/2013 ExitCare Patient Information 2015 [employer]. This information is not intended to replace advice given to you by your health care provider. Make sure you discuss any questions you have with your health care provider.  
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
## 2015-01-01 ENCOUNTER — Inpatient Hospital Stay (HOSPITAL_COMMUNITY)
Admission: AD | Admit: 2015-01-01 | Discharge: 2015-01-01 | Disposition: A | Discharge status: Home / Self Care | Payer: PRIVATE HEALTH INSURANCE | Source: Ambulatory Visit | Attending: Obstetrics & Gynecology | Admitting: Obstetrics & Gynecology

## 2015-01-01 ENCOUNTER — Encounter (HOSPITAL_COMMUNITY): Payer: Self-pay | Admitting: Obstetrics and Gynecology

## 2015-01-01 DIAGNOSIS — O0932 Supervision of pregnancy with insufficient antenatal care, second trimester: Secondary | ICD-10-CM | POA: Diagnosis not present

## 2015-01-01 DIAGNOSIS — R102 Pelvic and perineal pain: Secondary | ICD-10-CM | POA: Insufficient documentation

## 2015-01-01 DIAGNOSIS — Z3A17 17 weeks gestation of pregnancy: Secondary | ICD-10-CM

## 2015-01-01 DIAGNOSIS — O26899 Other specified pregnancy related conditions, unspecified trimester: Secondary | ICD-10-CM

## 2015-01-01 DIAGNOSIS — Z3A16 16 weeks gestation of pregnancy: Secondary | ICD-10-CM | POA: Diagnosis not present

## 2015-01-01 DIAGNOSIS — O9989 Other specified diseases and conditions complicating pregnancy, childbirth and the puerperium: Secondary | ICD-10-CM | POA: Diagnosis not present

## 2015-01-01 DIAGNOSIS — N949 Unspecified condition associated with female genital organs and menstrual cycle: Secondary | ICD-10-CM

## 2015-01-01 LAB — URINALYSIS, ROUTINE W REFLEX MICROSCOPIC
Bilirubin Urine: NEGATIVE
GLUCOSE, UA: NEGATIVE mg/dL
Hgb urine dipstick: NEGATIVE
Ketones, ur: NEGATIVE mg/dL
Leukocytes, UA: NEGATIVE
NITRITE: NEGATIVE
PH: 6 (ref 5.0–8.0)
Protein, ur: NEGATIVE mg/dL
Specific Gravity, Urine: 1.025 (ref 1.005–1.030)
Urobilinogen, UA: 0.2 mg/dL (ref 0.0–1.0)

## 2015-01-01 LAB — HEPATITIS B SURFACE ANTIGEN: Hepatitis B Surface Ag: NEGATIVE

## 2015-01-01 NOTE — MAU Provider Note (Signed)
History     CSN: [id number]  Arrival date and time: 01/01/15 1831   First Provider Initiated Contact with Patient 01/01/15 1916      Chief Complaint  Patient presents with  . Pelvic Pain   HPI Comments: Debra Mccarty is a 24 y.o. G2P0010 at 4424w6d with insidious onset over the past few days of left groin pain with walking and moving.    Abdominal Pain This is a new problem. The current episode started in the past 7 days. The onset quality is sudden. The problem occurs 2 to 4 times per day. Duration: lasts several minutes, waxes and wanes. The problem has been unchanged. The pain is located in the LLQ (localized to left groin). The pain is at a severity of 4/10. The quality of the pain is colicky and cramping. The abdominal pain does not radiate. Pertinent negatives include no constipation, diarrhea, dysuria, fever, frequency, headaches, hematuria, nausea, vomiting or weight loss. The pain is aggravated by movement and certain positions. The pain is relieved by being still. She has tried nothing for the symptoms.   No prenatal care. No bleeding or LOF. FM perceived. [facility]  OB History  Gravida Para Term Preterm AB SAB TAB Ectopic Multiple Living  2    1  1        # Outcome Date GA Lbr Len/2nd Weight Sex Delivery Anes PTL Lv  2 Current           1 TAB                 Past Medical History  Diagnosis Date  . Anemia     Past Surgical History  Procedure Laterality Date  . No past surgeries      Family History  Problem Relation Age of Onset  . Hypertension Maternal Grandmother   . Cancer Maternal Grandmother   . Arthritis Paternal Grandmother     History  Substance Use Topics  . Smoking status: Never Smoker   . Smokeless tobacco: Not on file  . Alcohol Use: No    Allergies: No Known Allergies  Prescriptions prior to admission  Medication Sig Dispense Refill Last Dose  . Prenat w/o A Vit-FeFum-FePo-FA (CONCEPT OB) 130-92.4-1  MG CAPS Take 1 tablet by mouth daily. 30 capsule 12 01/01/2015 at Unknown time    Review of Systems  Constitutional: Negative.  Negative for fever and weight loss.  Gastrointestinal: Positive for abdominal pain. Negative for nausea, vomiting, diarrhea and constipation.  Genitourinary: Negative for dysuria, frequency and hematuria.  Musculoskeletal: Negative for falls.  Neurological: Negative for headaches.  Psychiatric/Behavioral: Negative for depression. The patient is not nervous/anxious.    Physical Exam   Blood pressure 116/65, pulse 78, temperature 98.8 F (37.1 C), temperature source Oral, resp. rate 18, height 5\' 8"  (1.727 m), weight 80.468 kg (177 lb 6.4 oz), last menstrual period 09/05/2014.  Physical Exam  Nursing note and vitals reviewed. Constitutional: She is oriented to person, place, and time. She appears well-developed and well-nourished. No distress.  HENT:  Head: Normocephalic.  Eyes: Pupils are equal, round, and reactive to light.  Neck: Normal range of motion.  Cardiovascular: Normal rate.   Respiratory: Effort normal.  GI: Soft. There is no tenderness. There is no guarding.  S=D, FHR 160 by DT  Genitourinary: Vagina normal and uterus normal.  Cx posterior, long, closed  Musculoskeletal: Normal range of motion. She exhibits no edema.  Neurological: She is alert and oriented  to person, place, and time.  Skin: Skin is warm and dry.  Psychiatric: She has a normal mood and affect. Her behavior is normal. Judgment and thought content normal.    MAU Course  Procedures Results for orders placed or performed during the hospital encounter of 01/01/15 (from the past 24 hour(s))  Urinalysis, Routine w reflex microscopic (not at [facility])     Status: None   Collection Time: 01/01/15  6:40 PM  Result Value Ref Range   Color, Urine YELLOW YELLOW   APPearance CLEAR CLEAR   Specific Gravity, Urine 1.025 1.005 - 1.030   pH 6.0 5.0 - 8.0   Glucose, UA NEGATIVE NEGATIVE mg/dL    Hgb urine dipstick NEGATIVE NEGATIVE   Bilirubin Urine NEGATIVE NEGATIVE   Ketones, ur NEGATIVE NEGATIVE mg/dL   Protein, ur NEGATIVE NEGATIVE mg/dL   Urobilinogen, UA 0.2 0.0 - 1.0 mg/dL   Nitrite NEGATIVE NEGATIVE   Leukocytes, UA NEGATIVE NEGATIVE     Assessment and Plan  G2P0010 at [redacted]w[redacted]d Round Ligament Pain NPC  PLAN: Prenatal labs and outpatient [place] for anatomy scheduled.  Discharge with pregnancy precautions   Medication List    TAKE these medications        CONCEPT OB 130-92.4-1 MG Caps  Take 1 tablet by mouth daily.        Follow-up Information    Follow up with [facility] [place]. Schedule an appointment as soon as possible for a visit in 1 week.   Specialty:  Obstetrics and Gynecology   Contact information:   [address] [place] [address] [phone]    Someone from [place] Department will call you with appointment  Abisai Deer 01/01/2015, 7:17 PM

## 2015-01-01 NOTE — MAU Note (Signed)
Lab called and on their way to collect ordered specimens. Pt. And SO aware.

## 2015-01-01 NOTE — Discharge Instructions (Signed)
Round Ligament Pain During Pregnancy °Round ligament pain is a sharp pain or jabbing feeling often felt in the lower belly or groin area on one or both sides. It is one of the most common complaints during pregnancy and is considered a normal part of pregnancy. It is most often felt during the second trimester. ° °Here is what you need to know about round ligament pain, including some tips to help you feel better. ° °Causes of Round Ligament Pain ° °Several thick ligaments surround and support your womb (uterus) as it grows during pregnancy. One of them is called the round ligament. ° °The round ligament connects the front part of the womb to your groin, the area where your legs attach to your pelvis. The round ligament normally tightens and relaxes slowly. ° °As your baby and womb grow, the round ligament stretches. That makes it more likely to become strained. ° °Sudden movements can cause the ligament to tighten quickly, like a rubber band snapping. This causes a sudden and quick jabbing feeling. ° °Symptoms of Round Ligament Pain ° °Round ligament pain can be concerning and uncomfortable. But it is considered normal as your body changes during pregnancy. ° °The symptoms of round ligament pain include a sharp, sudden spasm in the belly. It usually affects the right side, but it may happen on both sides. The pain only lasts a few seconds. ° °Exercise may cause the pain, as will rapid movements such as: ° °sneezing °coughing °laughing °rolling over in bed °standing up too quickly ° °Treatment of Round Ligament Pain ° °Here are some tips that may help reduce your discomfort: ° °Pain relief. Take over-the-counter acetaminophen for pain, if necessary. Ask your doctor if this is OK. ° °Exercise. Get plenty of exercise to keep your stomach (core) muscles strong. Doing stretching exercises or prenatal yoga can be helpful. Ask your doctor which exercises are safe for you and your baby. ° °A helpful exercise involves  putting your hands and knees on the floor, lowering your head, and pushing your backside into the air. ° °Avoid sudden movements. Change positions slowly (such as standing up or sitting down) to avoid sudden movements that may cause stretching and pain. ° °Flex your hips. Bend and flex your hips before you cough, sneeze, or laugh to avoid pulling on the ligaments. ° °Apply warmth. A heating pad or warm bath may be helpful. Ask your doctor if this is OK. Extreme heat can be dangerous to the baby. ° °You should try to modify your daily activity level and avoid positions that may worsen the condition. ° °When to Call the Doctor/Midwife ° °Always tell your doctor or midwife about any type of pain you have during pregnancy. Round ligament pain is quick and doesn't last long. ° °Call your health care provider immediately if you have: ° °severe pain °fever °chills °pain on urination °difficulty walking ° °Belly pain during pregnancy can be due to many different causes. It is important for your doctor to rule out more serious conditions, including pregnancy complications such as placenta abruption or non-pregnancy illnesses such as: ° °inguinal hernia °appendicitis °stomach, liver, and kidney problems °Preterm labor pains may sometimes be mistaken for round ligament pain. °First Trimester of Pregnancy °The first trimester of pregnancy is from week 1 until the end of week 12 (months 1 through 3). A week after a sperm fertilizes an egg, the egg will implant on the wall of the uterus. This embryo will begin to develop into   a baby. Genes from you and your partner are forming the baby. The female genes determine whether the baby is a boy or a girl. At 6-8 weeks, the eyes and face are formed, and the heartbeat can be seen on ultrasound. At the end of 12 weeks, all the baby's organs are formed.  °Now that you are pregnant, you will want to do everything you can to have a healthy baby. Two of the most important things are to get good  prenatal care and to follow your health care provider's instructions. Prenatal care is all the medical care you receive before the baby's birth. This care will help prevent, find, and treat any problems during the pregnancy and childbirth. °BODY CHANGES °Your body goes through many changes during pregnancy. The changes vary from woman to woman.  °· You may gain or lose a couple of pounds at first. °· You may feel sick to your stomach (nauseous) and throw up (vomit). If the vomiting is uncontrollable, call your health care provider. °· You may tire easily. °· You may develop headaches that can be relieved by medicines approved by your health care provider. °· You may urinate more often. Painful urination may mean you have a bladder infection. °· You may develop heartburn as a result of your pregnancy. °· You may develop constipation because certain hormones are causing the muscles that push waste through your intestines to slow down. °· You may develop hemorrhoids or swollen, bulging veins (varicose veins). °· Your breasts may begin to grow larger and become tender. Your nipples may stick out more, and the tissue that surrounds them (areola) may become darker. °· Your gums may bleed and may be sensitive to brushing and flossing. °· Dark spots or blotches (chloasma, mask of pregnancy) may develop on your face. This will likely fade after the baby is born. °· Your menstrual periods will stop. °· You may have a loss of appetite. °· You may develop cravings for certain kinds of food. °· You may have changes in your emotions from day to day, such as being excited to be pregnant or being concerned that something may go wrong with the pregnancy and baby. °· You may have more vivid and strange dreams. °· You may have changes in your hair. These can include thickening of your hair, rapid growth, and changes in texture. Some women also have hair loss during or after pregnancy, or hair that feels dry or thin. Your hair will  most likely return to normal after your baby is born. °WHAT TO EXPECT AT YOUR PRENATAL VISITS °During a routine prenatal visit: °· You will be weighed to make sure you and the baby are growing normally. °· Your blood pressure will be taken. °· Your abdomen will be measured to track your baby's growth. °· The fetal heartbeat will be listened to starting around week 10 or 12 of your pregnancy. °· Test results from any previous visits will be discussed. °Your health care provider may ask you: °· How you are feeling. °· If you are feeling the baby move. °· If you have had any abnormal symptoms, such as leaking fluid, bleeding, severe headaches, or abdominal cramping. °· If you have any questions. °Other tests that may be performed during your first trimester include: °· Blood tests to find your blood type and to check for the presence of any previous infections. They will also be used to check for low iron levels (anemia) and Rh antibodies. Later in the pregnancy, blood   tests for diabetes will be done along with other tests if problems develop. °· Urine tests to check for infections, diabetes, or protein in the urine. °· An ultrasound to confirm the proper growth and development of the baby. °· An amniocentesis to check for possible genetic problems. °· Fetal screens for spina bifida and Down syndrome. °· You may need other tests to make sure you and the baby are doing well. °HOME CARE INSTRUCTIONS  °Medicines °· Follow your health care provider's instructions regarding medicine use. Specific medicines may be either safe or unsafe to take during pregnancy. °· Take your prenatal vitamins as directed. °· If you develop constipation, try taking a stool softener if your health care provider approves. °Diet °· Eat regular, well-balanced meals. Choose a variety of foods, such as meat or vegetable-based protein, fish, milk and low-fat dairy products, vegetables, fruits, and whole grain breads and cereals. Your health care  provider will help you determine the amount of weight gain that is right for you. °· Avoid raw meat and uncooked cheese. These carry germs that can cause birth defects in the baby. °· Eating four or five small meals rather than three large meals a day may help relieve nausea and vomiting. If you start to feel nauseous, eating a few soda crackers can be helpful. Drinking liquids between meals instead of during meals also seems to help nausea and vomiting. °· If you develop constipation, eat more high-fiber foods, such as fresh vegetables or fruit and whole grains. Drink enough fluids to keep your urine clear or pale yellow. °Activity and Exercise °· Exercise only as directed by your health care provider. Exercising will help you: °¨ Control your weight. °¨ Stay in shape. °¨ Be prepared for labor and delivery. °· Experiencing pain or cramping in the lower abdomen or low back is a good sign that you should stop exercising. Check with your health care provider before continuing normal exercises. °· Try to avoid standing for long periods of time. Move your legs often if you must stand in one place for a long time. °· Avoid heavy lifting. °· Wear low-heeled shoes, and practice good posture. °· You may continue to have sex unless your health care provider directs you otherwise. °Relief of Pain or Discomfort °· Wear a good support bra for breast tenderness.   °· Take warm sitz baths to soothe any pain or discomfort caused by hemorrhoids. Use hemorrhoid cream if your health care provider approves.   °· Rest with your legs elevated if you have leg cramps or low back pain. °· If you develop varicose veins in your legs, wear support hose. Elevate your feet for 15 minutes, 3-4 times a day. Limit salt in your diet. °Prenatal Care °· Schedule your prenatal visits by the twelfth week of pregnancy. They are usually scheduled monthly at first, then more often in the last 2 months before delivery. °· Write down your questions. Take  them to your prenatal visits. °· Keep all your prenatal visits as directed by your health care provider. °Safety °· Wear your seat belt at all times when driving. °· Make a list of emergency phone numbers, including numbers for family, friends, the hospital, and police and fire departments. °General Tips °· Ask your health care provider for a referral to a local prenatal education class. Begin classes no later than at the beginning of month 6 of your pregnancy. °· Ask for help if you have counseling or nutritional needs during pregnancy. Your health care provider can   offer advice or refer you to specialists for help with various needs. °· Do not use hot tubs, steam rooms, or saunas. °· Do not douche or use tampons or scented sanitary pads. °· Do not cross your legs for long periods of time. °· Avoid cat litter boxes and soil used by cats. These carry germs that can cause birth defects in the baby and possibly loss of the fetus by miscarriage or stillbirth. °· Avoid all smoking, herbs, alcohol, and medicines not prescribed by your health care provider. Chemicals in these affect the formation and growth of the baby. °· Schedule a dentist appointment. At home, brush your teeth with a soft toothbrush and be gentle when you floss. °SEEK MEDICAL CARE IF:  °· You have dizziness. °· You have mild pelvic cramps, pelvic pressure, or nagging pain in the abdominal area. °· You have persistent nausea, vomiting, or diarrhea. °· You have a bad smelling vaginal discharge. °· You have pain with urination. °· You notice increased swelling in your face, hands, legs, or ankles. °SEEK IMMEDIATE MEDICAL CARE IF:  °· You have a fever. °· You are leaking fluid from your vagina. °· You have spotting or bleeding from your vagina. °· You have severe abdominal cramping or pain. °· You have rapid weight gain or loss. °· You vomit blood or material that looks like coffee grounds. °· You are exposed to German measles and have never had  them. °· You are exposed to fifth disease or chickenpox. °· You develop a severe headache. °· You have shortness of breath. °· You have any kind of trauma, such as from a fall or a car accident. °Document Released: 07/19/2001 Document Revised: 12/09/2013 Document Reviewed: 06/04/2013 °ExitCare® Patient Information ©2015 [employer]. This information is not intended to replace advice given to you by your health care provider. Make sure you discuss any questions you have with your health care provider. ° °

## 2015-01-01 NOTE — MAU Note (Signed)
Pt reports having sharp pain on the side of her pelvis and abd for the past few days. Denies vag bleeding or cramping.

## 2015-01-02 LAB — RUBELLA SCREEN: Rubella: 2.75 index (ref >0.99)

## 2015-01-02 LAB — SICKLE CELL SCREEN: Sickle Cell Screen: NEGATIVE

## 2015-01-12 ENCOUNTER — Telehealth: Payer: Self-pay

## 2015-01-12 ENCOUNTER — Ambulatory Visit (HOSPITAL_COMMUNITY)
Admission: RE | Admit: 2015-01-12 | Discharge: 2015-01-12 | Disposition: A | Discharge status: Home / Self Care | Payer: 59 | Source: Ambulatory Visit | Attending: Obstetrics and Gynecology | Admitting: Obstetrics and Gynecology

## 2015-01-12 DIAGNOSIS — Z36 Encounter for antenatal screening of mother: Secondary | ICD-10-CM | POA: Insufficient documentation

## 2015-01-12 DIAGNOSIS — O26899 Other specified pregnancy related conditions, unspecified trimester: Secondary | ICD-10-CM

## 2015-01-12 DIAGNOSIS — Z3A18 18 weeks gestation of pregnancy: Secondary | ICD-10-CM | POA: Diagnosis not present

## 2015-01-12 DIAGNOSIS — O0932 Supervision of pregnancy with insufficient antenatal care, second trimester: Secondary | ICD-10-CM | POA: Diagnosis not present

## 2015-01-12 DIAGNOSIS — R102 Pelvic and perineal pain: Secondary | ICD-10-CM

## 2015-01-12 DIAGNOSIS — Z3689 Encounter for other specified antenatal screening: Secondary | ICD-10-CM | POA: Insufficient documentation

## 2015-01-12 NOTE — Telephone Encounter (Signed)
Called patient and informed her of U/S results. [facility]. Patient verbalized understanding and states she'll be calling to schedule an appointment at another office. No questions or concerns.

## 2015-01-12 NOTE — Telephone Encounter (Signed)
-----   Message from Emeline DarlingKasie E Kiser sent at 01/12/2015  9:24 AM EDT ----- Regarding: Ultrasound Results We have just completed a 2nd or 3rd trimester outpatient ultrasound scheduled for a patient who was seen in MAU.  Please call the patient with the results.

## 2015-04-04 ENCOUNTER — Inpatient Hospital Stay (HOSPITAL_COMMUNITY)
Admission: AD | Admit: 2015-04-04 | Discharge: 2015-04-04 | Disposition: A | Discharge status: Home / Self Care | Payer: Medicaid Other | Source: Ambulatory Visit | Attending: Obstetrics and Gynecology | Admitting: Obstetrics and Gynecology

## 2015-04-04 ENCOUNTER — Encounter (HOSPITAL_COMMUNITY): Payer: Self-pay | Admitting: *Deleted

## 2015-04-04 DIAGNOSIS — O9989 Other specified diseases and conditions complicating pregnancy, childbirth and the puerperium: Secondary | ICD-10-CM

## 2015-04-04 DIAGNOSIS — M79601 Pain in right arm: Secondary | ICD-10-CM

## 2015-04-04 DIAGNOSIS — R2231 Localized swelling, mass and lump, right upper limb: Secondary | ICD-10-CM | POA: Diagnosis present

## 2015-04-04 DIAGNOSIS — Z3A30 30 weeks gestation of pregnancy: Secondary | ICD-10-CM | POA: Insufficient documentation

## 2015-04-04 LAB — CBC
HCT: 34 % — ABNORMAL LOW (ref 36.0–46.0)
Hemoglobin: 11.5 g/dL — ABNORMAL LOW (ref 12.0–15.0)
MCH: 30.7 pg (ref 26.0–34.0)
MCHC: 33.8 g/dL (ref 30.0–36.0)
MCV: 90.9 fL (ref 78.0–100.0)
PLATELETS: 193 10*3/uL (ref 150–400)
RBC: 3.74 MIL/uL — AB (ref 3.87–5.11)
RDW: 13.3 % (ref 11.5–15.5)
WBC: 7.5 10*3/uL (ref 4.0–10.5)

## 2015-04-04 LAB — DIFFERENTIAL
Basophils Absolute: 0 10*3/uL (ref 0.0–0.1)
Basophils Relative: 0 % (ref 0–1)
EOS PCT: 3 % (ref 0–5)
Eosinophils Absolute: 0.2 10*3/uL (ref 0.0–0.7)
LYMPHS PCT: 25 % (ref 12–46)
Lymphs Abs: 1.8 10*3/uL (ref 0.7–4.0)
Monocytes Absolute: 0.8 10*3/uL (ref 0.1–1.0)
Monocytes Relative: 10 % (ref 3–12)
Neutro Abs: 4.6 10*3/uL (ref 1.7–7.7)
Neutrophils Relative %: 62 % (ref 43–77)

## 2015-04-04 LAB — OB RESULTS CONSOLE RPR: RPR: NONREACTIVE

## 2015-04-04 NOTE — MAU Provider Note (Signed)
History     CSN: [id number]  Arrival date and time: 04/04/15 1335   First Provider Initiated Contact with Patient 04/04/15 1549      Chief Complaint  Patient presents with  . Bump on arm    HPI  Patient is 24 y.o. G2P0010 [redacted]w[redacted]d here with complaints of mass on R forearm.  Debra Mccarty reports that for the past three months she has had a lump on her R forearm. She says it has not changed, however she just got her insurance card and felt like she had enough free time today to come to the MAU. She says it has not grown in size in the past couple months. It is tender to palpation. Does not recall any trauma or insect bites. Denies rashes, bruising, itching, fevers/chills, HA.   Has not received any prenatal care since about 20 weeks, as she said she did not have an insurance card. No pregnancy-related concerns. +FM, denies LOF, VB, contractions, vaginal discharge.    Past Medical History  Diagnosis Date  . Anemia     Past Surgical History  Procedure Laterality Date  . No past surgeries      Family History  Problem Relation Age of Onset  . Hypertension Maternal Grandmother   . Cancer Maternal Grandmother   . Arthritis Paternal Grandmother     Social History  Substance Use Topics  . Smoking status: Former Games developer  . Smokeless tobacco: None  . Alcohol Use: No    Allergies: No Known Allergies  Prescriptions prior to admission  Medication Sig Dispense Refill Last Dose  . Prenat w/o A Vit-FeFum-FePo-FA (CONCEPT OB) 130-92.4-1 MG CAPS Take 1 tablet by mouth daily. 30 capsule 12 04/03/2015 at Unknown time    Review of Systems  Constitutional: Negative for fever and chills.  Eyes: Negative for blurred vision and double vision.  Respiratory: Negative for shortness of breath.   Cardiovascular: Positive for leg swelling. Negative for chest pain.  Gastrointestinal: Negative for nausea, vomiting and abdominal pain.  Musculoskeletal:       Pain on R forearm  Skin: Negative for  itching and rash.  Neurological: Negative for focal weakness, weakness and headaches.   Physical Exam   Blood pressure 114/76, pulse 83, temperature 97.8 F (36.6 C), temperature source Oral, resp. rate 18, height 5\' 8"  (1.727 m), weight 197 lb 6 oz (89.529 kg), last menstrual period 09/05/2014.  Physical Exam  Constitutional: She is oriented to person, place, and time. She appears well-developed and well-nourished. No distress.  HENT:  Head: Normocephalic and atraumatic.  Cardiovascular: Normal rate, regular rhythm and normal heart sounds.   No murmur heard. Respiratory: Effort normal and breath sounds normal. No respiratory distress. She has no wheezes.  GI: Soft. Bowel sounds are normal. She exhibits no distension. There is no tenderness.  Gravid  Musculoskeletal: Normal range of motion. She exhibits no edema.  Soft, mildly tender non-mobile mass on R forearm  Neurological: She is alert and oriented to person, place, and time.  Skin: Skin is warm and dry. No rash noted. No erythema.  Psychiatric: She has a normal mood and affect. Her behavior is normal.    MAU Course  Procedures None  NST: 145/mod/+accels, no decels Toco: quiet  Assessment and Plan   A: Patient is 24 y.o. G2P0010 [redacted]w[redacted]d reporting mass on R forearm.   P: Given no change in status of lump, no abnormal PE or lab findings, and reassuring fetal monitoring, will discharge home.  - Heavily  encouraged patient to call clinic first thing Monday AM to schedule a prenatal appt now that she has has insurance - Reviewed findings and my conclusion - Preterm labor precautions dicussed - Handout given  Tarri Abernethy 04/04/2015, 4:11 PM   OB fellow attestation:  I have seen and examined this patient; I agree with above documentation in the resident's note. Patient had mildly warm area on lateral right forearm with mild TTP. No overlying erythema or concern for infection.  Likely normal variant and agree with abvoe  management. Discussed importance of prenatal care. Patient voiced understanding. I have sent a message to the [facility] admin pool to schedule patient to establish [facility].  Debra Flake, MD 6:32 PM

## 2015-04-04 NOTE — MAU Note (Signed)
Patient reports bump on arm, noticed it about 3 months ago.  States it has not changed in size or started hurting more.  Reports it only hurts when it is touched.

## 2015-04-04 NOTE — Discharge Instructions (Signed)
It is very important to receive prenatal care as soon as possible since you are so far along in your pregnancy. Please call our [facility] at [phone] first thing Monday morning to schedule an appointment as soon as possible.    Prenatal Care  WHAT IS PRENATAL CARE?  Prenatal care means health care during your pregnancy, before your baby is born. It is very important to take care of yourself and your baby during your pregnancy by:   Getting early prenatal care. If you know you are pregnant, or think you might be pregnant, call your health care provider as soon as possible. Schedule a visit for a prenatal exam.  Getting regular prenatal care. Follow your health care provider's schedule for blood and other necessary tests. Do not miss appointments.  Doing everything you can to keep yourself and your baby healthy during your pregnancy.  Getting complete care. Prenatal care should include evaluation of the medical, dietary, educational, psychological, and social needs of you and your significant other. The medical and genetic history of your family and the family of your baby's father should be discussed with your health care provider.  Discussing with your health care provider:  Prescription, over-the-counter, and herbal medicines that you take.  Any history of substance abuse, alcohol use, smoking, and illegal drug use.  Any history of domestic abuse and violence.  Immunizations you have received.  Your nutrition and diet.  The amount of exercise you do.  Any environmental and occupational hazards to which you are exposed.  History of sexually transmitted infections for both you and your partner.  Previous pregnancies you have had. WHY IS PRENATAL CARE SO IMPORTANT?  By regularly seeing your health care provider, you help ensure that problems can be identified early so that they can be treated as soon as possible. Other problems might be prevented. Many  studies have shown that early and regular prenatal care is important for the health of mothers and their babies.  HOW CAN I TAKE CARE OF MYSELF WHILE I AM PREGNANT?  Here are ways to take care of yourself and your baby:   Start or continue taking your multivitamin with 400 micrograms (mcg) of folic acid every day.  Get early and regular prenatal care. It is very important to see a health care provider during your pregnancy. Your health care provider will check at each visit to make sure that you and your baby are healthy. If there are any problems, action can be taken right away to help you and your baby.  Eat a healthy diet that includes:  Fruits.  Vegetables.  Foods low in saturated fat.  Whole grains.  Calcium-rich foods, such as milk, yogurt, and hard cheeses.  Drink 6-8 glasses of liquids a day.  Unless your health care provider tells you not to, try to be physically active for 30 minutes, most days of the week. If you are pressed for time, you can get your activity in through 10-minute segments, three times a day.  Do not smoke, drink alcohol, or use drugs. These can cause long-term damage to your baby. Talk with your health care provider about steps to take to stop smoking. Talk with a member of your faith community, a counselor, a trusted friend, or your health care provider if you are concerned about your alcohol or drug use.  Ask your health care provider before taking any medicine, even over-the-counter medicines. Some medicines are not safe to take during pregnancy.  Get plenty of rest and sleep.  Avoid hot tubs and saunas during pregnancy.  Do not have X-rays taken unless absolutely necessary and with the recommendation of your health care provider. A lead shield can be placed on your abdomen to protect your baby when X-rays are taken in other parts of your body.  Do not empty the cat litter when you are pregnant. It may contain a parasite that causes an infection  called toxoplasmosis, which can cause birth defects. Also, use gloves when working in garden areas used by cats.  Do not eat uncooked or undercooked meats or fish.  Do not eat soft, mold-ripened cheeses (Brie, Camembert, and chevre) or soft, blue-veined cheese (Danish blue and Roquefort).  Stay away from toxic chemicals like:  Insecticides.  Solvents (some cleaners or paint thinners).  Lead.  Mercury.  Sexual intercourse may continue until the end of the pregnancy, unless you have a medical problem or there is a problem with the pregnancy and your health care provider tells you not to.  Do not wear high-heel shoes, especially during the second half of the pregnancy. You can lose your balance and fall.  Do not take long trips, unless absolutely necessary. Be sure to see your health care provider before going on the trip.  Do not sit in one position for more than 2 hours when on a trip.  Take a copy of your medical records when going on a trip. Know where a hospital is located in the city you are visiting, in case of an emergency.  Most dangerous household products will have pregnancy warnings on their labels. Ask your health care provider about products if you are unsure.  Limit or eliminate your caffeine intake from coffee, tea, sodas, medicines, and chocolate.  Many women continue working through pregnancy. Staying active might help you stay healthier. If you have a question about the safety or the hours you work at your particular job, talk with your health care provider.  Get informed:  Read books.  Watch videos.  Go to childbirth classes for you and your significant other.  Talk with experienced moms.  Ask your health care provider about childbirth education classes for you and your partner. Classes can help you and your partner prepare for the birth of your baby.  Ask about a baby doctor (pediatrician) and methods and pain medicine for labor, delivery, and possible  cesarean delivery. HOW OFTEN SHOULD I SEE MY HEALTH CARE PROVIDER DURING PREGNANCY?  Your health care provider will give you a schedule for your prenatal visits. You will have visits more often as you get closer to the end of your pregnancy. An average pregnancy lasts about 40 weeks.  A typical schedule includes visiting your health care provider:   About once each month during your first 6 months of pregnancy.  Every 2 weeks during the next 2 months.  Weekly in the last month, until the delivery date. Your health care provider will probably want to see you more often if:  You are older than 35 years.  Your pregnancy is high risk because you have certain health problems or problems with the pregnancy, such as:  Diabetes.  High blood pressure.  The baby is not growing on schedule, according to the dates of the pregnancy. Your health care provider will do special tests to make sure you and your baby are not having any serious problems. WHAT HAPPENS DURING PRENATAL VISITS?   At your first prenatal visit, your health care  provider will do a physical exam and talk to you about your health history and the health history of your partner and your family. Your health care provider will be able to tell you what date to expect your baby to be born on.  Your first physical exam will include checks of your blood pressure, measurements of your height and weight, and an exam of your pelvic organs. Your health care provider will do a Pap test if you have not had one recently and will do cultures of your cervix to make sure there is no infection.  At each prenatal visit, there will be tests of your blood, urine, blood pressure, weight, and the progress of the baby will be checked.  At your later prenatal visits, your health care provider will check how you are doing and how your baby is developing. You may have a number of tests done as your pregnancy progresses.  Ultrasound exams are often used to  check on your baby's growth and health.  You may have more urine and blood tests, as well as special tests, if needed. These may include amniocentesis to examine fluid in the pregnancy sac, stress tests to check how the baby responds to contractions, or a biophysical profile to measure your baby's well-being. Your health care provider will explain the tests and why they are necessary.  You should be tested for high blood sugar (gestational diabetes) between the 24th and 28th weeks of your pregnancy.  You should discuss with your health care provider your plans to breastfeed or bottle-feed your baby.  Each visit is also a chance for you to learn about staying healthy during pregnancy and to ask questions. Document Released: 07/28/2003 Document Revised: 07/30/2013 Document Reviewed: 10/09/2013 [facility] Patient Information 2015 [place], [place]. This information is not intended to replace advice given to you by your health care provider. Make sure you discuss any questions you have with your health care provider. Premature Rupture and Preterm Premature Rupture of Membranes A sac made up of membranes surrounds your baby in the womb (uterus). When this sac breaks before contractions or labor starts, it is called premature rupture of membranes (PROM). Rupture of membranes is also known as your water breaking. If this happens before 37 weeks, it is called preterm premature rupture of membranes (PPROM). PPROM is serious. It needs medical care right away. CAUSES  PROM may be caused by the membranes getting weak. This happens at the end of pregnancy. PPROM is often due to an infection, but can be caused by a number of other things.  SIGNS OF PROM OR PPROM  A sudden gush of fluid from the vagina.  A slow leak of fluid from the vagina.  Your underwear stay wet. WHAT TO DO IF YOU THINK YOUR WATER BROKE Call your doctor right away. You will need to go to the hospital to get checked right away. WHAT HAPPENS  IF YOU ARE TOLD YOU HAVE PROM OR PPROM? You will have tests done at the hospital. If you have PROM, you may be given medicine to start labor (induced). This may happen if you are not having contractions within 24 hours of your water breaking. If you have PPROM and are not having contractions, you may be given medicine to start labor. It will depend on how far along you are in your pregnancy. If you have PPROM, you:  And your baby will be watched closely for signs of infection or other problems.  May be given an antibiotic  medicine. This can stop an infection from starting.  May be given a steroid medicine. This can help the lungs to develop faster.  May be given a medicine to stop early labor (preterm labor).  May be told to stay in bed except to use the restroom (bed rest).  May be given medicine to start labor. This can happen if there are problems with you or the baby. Your treatment will depend on many factors. Document Released: 10/21/2008 Document Revised: 03/27/2013 Document Reviewed: 11/13/2012 [facility] Patient Information 2015 [place], [place]. This information is not intended to replace advice given to you by your health care provider. Make sure you discuss any questions you have with your health care provider.

## 2015-04-04 NOTE — MAU Note (Signed)
Pt states here for bump on her r forearm noted for 3 months. Denies pregnancy issues.

## 2015-04-05 LAB — RPR: RPR Ser Ql: NONREACTIVE

## 2015-04-08 ENCOUNTER — Other Ambulatory Visit (HOSPITAL_COMMUNITY)
Admission: RE | Admit: 2015-04-08 | Discharge: 2015-04-08 | Disposition: A | Discharge status: Home / Self Care | Payer: Medicaid Other | Source: Ambulatory Visit | Attending: Physician Assistant | Admitting: Physician Assistant

## 2015-04-08 ENCOUNTER — Ambulatory Visit (INDEPENDENT_AMBULATORY_CARE_PROVIDER_SITE_OTHER): Payer: Medicaid Other | Admitting: Physician Assistant

## 2015-04-08 ENCOUNTER — Encounter: Payer: Self-pay | Admitting: Physician Assistant

## 2015-04-08 VITALS — BP 117/66 | HR 89 | Temp 97.9°F | Wt 197.9 lb

## 2015-04-08 DIAGNOSIS — Z113 Encounter for screening for infections with a predominantly sexual mode of transmission: Secondary | ICD-10-CM | POA: Diagnosis present

## 2015-04-08 DIAGNOSIS — O0933 Supervision of pregnancy with insufficient antenatal care, third trimester: Secondary | ICD-10-CM

## 2015-04-08 DIAGNOSIS — Z01419 Encounter for gynecological examination (general) (routine) without abnormal findings: Secondary | ICD-10-CM | POA: Diagnosis present

## 2015-04-08 DIAGNOSIS — O0932 Supervision of pregnancy with insufficient antenatal care, second trimester: Secondary | ICD-10-CM

## 2015-04-08 DIAGNOSIS — Z3493 Encounter for supervision of normal pregnancy, unspecified, third trimester: Secondary | ICD-10-CM

## 2015-04-08 DIAGNOSIS — Z118 Encounter for screening for other infectious and parasitic diseases: Secondary | ICD-10-CM

## 2015-04-08 DIAGNOSIS — Z124 Encounter for screening for malignant neoplasm of cervix: Secondary | ICD-10-CM | POA: Diagnosis not present

## 2015-04-08 LAB — POCT URINALYSIS DIP (DEVICE)
Bilirubin Urine: NEGATIVE
Glucose, UA: NEGATIVE mg/dL
Hgb urine dipstick: NEGATIVE
Ketones, ur: NEGATIVE mg/dL
Leukocytes, UA: NEGATIVE
NITRITE: NEGATIVE
PH: 7 (ref 5.0–8.0)
Protein, ur: NEGATIVE mg/dL
Specific Gravity, Urine: 1.01 (ref 1.005–1.030)
UROBILINOGEN UA: 0.2 mg/dL (ref 0.0–1.0)

## 2015-04-08 LAB — GLUCOSE TOLERANCE, 1 HOUR (50G) W/O FASTING: Glucose, 1 Hour GTT: 73 mg/dL (ref 70–140)

## 2015-04-08 MED ORDER — TETANUS-DIPHTH-ACELL PERTUSSIS 5-2.5-18.5 LF-MCG/0.5 IM SUSP: 0.5000 mL | Freq: Once | INTRAMUSCULAR | Status: DC

## 2015-04-08 NOTE — Patient Instructions (Addendum)
Third Trimester of Pregnancy The third trimester is from week 29 through week 42, months 7 through 9. This trimester is when your unborn baby (fetus) is growing very fast. At the end of the ninth month, the unborn baby is about 20 inches in length. It weighs about 6-10 pounds.  HOME CARE   Avoid all smoking, herbs, and alcohol. Avoid drugs not approved by your doctor.  Only take medicine as told by your doctor. Some medicines are safe and some are not during pregnancy.  Exercise only as told by your doctor. Stop exercising if you start having cramps.  Eat regular, healthy meals.  Wear a good support bra if your breasts are tender.  Do not use hot tubs, steam rooms, or saunas.  Wear your seat belt when driving.  Avoid raw meat, uncooked cheese, and liter boxes and soil used by cats.  Take your prenatal vitamins.  Try taking medicine that helps you poop (stool softener) as needed, and if your doctor approves. Eat more fiber by eating fresh fruit, vegetables, and whole grains. Drink enough fluids to keep your pee (urine) clear or pale yellow.  Take warm water baths (sitz baths) to soothe pain or discomfort caused by hemorrhoids. Use hemorrhoid cream if your doctor approves.  If you have puffy, bulging veins (varicose veins), wear support hose. Raise (elevate) your feet for 15 minutes, 3-4 times a day. Limit salt in your diet.  Avoid heavy lifting, wear low heels, and sit up straight.  Rest with your legs raised if you have leg cramps or low back pain.  Visit your dentist if you have not gone during your pregnancy. Use a soft toothbrush to brush your teeth. Be gentle when you floss.  You can have sex (intercourse) unless your doctor tells you not to.  Do not travel far distances unless you must. Only do so with your doctor's approval.  Take prenatal classes.  Practice driving to the hospital.  Pack your hospital bag.  Prepare the baby's room.  Go to your doctor visits. GET  HELP IF:  You are not sure if you are in labor or if your water has broken.  You are dizzy.  You have mild cramps or pressure in your lower belly (abdominal).  You have a nagging pain in your belly area.  You continue to feel sick to your stomach (nauseous), throw up (vomit), or have watery poop (diarrhea).  You have bad smelling fluid coming from your vagina.  You have pain with peeing (urination). GET HELP RIGHT AWAY IF:   You have a fever.  You are leaking fluid from your vagina.  You are spotting or bleeding from your vagina.  You have severe belly cramping or pain.  You lose or gain weight rapidly.  You have trouble catching your breath and have chest pain.  You notice sudden or extreme puffiness (swelling) of your face, hands, ankles, feet, or legs.  You have not felt the baby move in over an hour.  You have severe headaches that do not go away with medicine.  You have vision changes. Document Released: 10/19/2009 Document Revised: 11/19/2012 Document Reviewed: 09/25/2012 [facility] Patient Information 2015 [place], [place]. This information is not intended to replace advice given to you by your health care provider. Make sure you discuss any questions you have with your health care provider.    Td Vaccine (Tetanus and Diphtheria): What You Need to Know 1. Why get vaccinated? Tetanus  and diphtheria are very serious diseases.  They are rare in the [place] today, but people who do become infected often have severe complications. Td vaccine is used to protect adolescents and adults from both of these diseases. Both tetanus and diphtheria are infections caused by bacteria. Diphtheria spreads from person to person through coughing or sneezing. Tetanus-causing bacteria enter the body through cuts, scratches, or wounds. TETANUS (Lockjaw) causes painful muscle tightening and stiffness, usually all over the body.  It can lead to tightening of muscles in the head and  neck so you can't open your mouth, swallow, or sometimes even breathe. Tetanus kills about 1 out of every 5 people who are infected. DIPHTHERIA can cause a thick coating to form in the back of the throat.  It can lead to breathing problems, paralysis, heart failure, and death. Before vaccines, the [place] saw as many as 200,000 cases a year of diphtheria and hundreds of cases of tetanus. Since vaccination began, cases of both diseases have dropped by about 99%. 2. Td vaccine Td vaccine can protect adolescents and adults from tetanus and diphtheria. Td is usually given as a booster dose every 10 years but it can also be given earlier after a severe and dirty wound or burn. Your doctor can give you more information. Td may safely be given at the same time as other vaccines. 3. Some people should not get this vaccine  If you ever had a life-threatening allergic reaction after a dose of any tetanus or diphtheria containing vaccine, OR if you have a severe allergy to any part of this vaccine, you should not get Td. Tell your doctor if you have any severe allergies.  Talk to your doctor if you:  have epilepsy or another nervous system problem,  had severe pain or swelling after any vaccine containing diphtheria or tetanus,  ever had Guillain Barr Syndrome (GBS),  aren't feeling well on the day the shot is scheduled. 4. Risks of a vaccine reaction With a vaccine, like any medicine, there is a chance of side effects. These are usually mild and go away on their own. Serious side effects are also possible, but are very rare. Most people who get Td vaccine do not have any problems with it. Mild Problems  following Td (Did not interfere with activities)  Pain where the shot was given (about 8 people in 10)  Redness or swelling where the shot was given (about 1 person in 3)  Mild fever (about 1 person in 15)  Headache or Tiredness (uncommon) Moderate Problems following Td (Interfered  with activities, but did not require medical attention)  Fever over 102F (rare) Severe Problems  following Td (Unable to perform usual activities; required medical attention)  Swelling, severe pain, bleeding and/or redness in the arm where the shot was given (rare). Problems that could happen after any vaccine:  Brief fainting spells can happen after any medical procedure, including vaccination. Sitting or lying down for about 15 minutes can help prevent fainting, and injuries caused by a fall. Tell your doctor if you feel dizzy, or have vision changes or ringing in the ears.  Severe shoulder pain and reduced range of motion in the arm where a shot was given can happen, very rarely, after a vaccination.  Severe allergic reactions from a vaccine are very rare, estimated at less than 1 in a million doses. If one were to occur, it would usually be within a few minutes to a few hours after the vaccination. 5. What if there is a  serious reaction? What should I look for?  Look for anything that concerns you, such as signs of a severe allergic reaction, very high fever, or behavior changes. Signs of a severe allergic reaction can include hives, swelling of the face and throat, difficulty breathing, a fast heartbeat, dizziness, and weakness. These would usually start a few minutes to a few hours after the vaccination. What should I do?  If you think it is a severe allergic reaction or other emergency that can't wait, call 9-1-1 or get the person to the nearest hospital. Otherwise, call your doctor.  Afterward, the reaction should be reported to the Vaccine Adverse Event Reporting System (VAERS). Your doctor might file this report, or you can do it yourself through the VAERS web site at www.vaers.LAgents.no, or by calling 1-(817)406-0112. VAERS is only for reporting reactions. They do not give medical advice. 6. The National Vaccine Injury Compensation Program The [employer] Vaccine Injury Compensation  Program (VICP) is a federal program that was created to compensate people who may have been injured by certain vaccines. Persons who believe they may have been injured by a vaccine can learn about the program and about filing a claim by calling 1-(707) 010-1994 or visiting the VICP website at SpiritualWord.at. 7. How can I learn more?  Ask your doctor.  Contact your local or state health department.  Contact the Centers for Disease Control and Prevention (CDC):  Call 318-421-5123 (1-800-CDC-INFO)  Visit CDC's website at PicCapture.uy CDC Td Vaccine Interim VIS (09/11/12) Document Released: 05/22/2006 Document Revised: 12/09/2013 Document Reviewed: 11/06/2013 [facility] Patient Information 2015 [place], [place]. This information is not intended to replace advice given to you by your health care provider. Make sure you discuss any questions you have with your health care provider.  Third Trimester of Pregnancy The third trimester is from week 29 through week 42, months 7 through 9. The third trimester is a time when the fetus is growing rapidly. At the end of the ninth month, the fetus is about 20 inches in length and weighs 6-10 pounds.  BODY CHANGES Your body goes through many changes during pregnancy. The changes vary from woman to woman.   Your weight will continue to increase. You can expect to gain 25-35 pounds (11-16 kg) by the end of the pregnancy.  You may begin to get stretch marks on your hips, abdomen, and breasts.  You may urinate more often because the fetus is moving lower into your pelvis and pressing on your bladder.  You may develop or continue to have heartburn as a result of your pregnancy.  You may develop constipation because certain hormones are causing the muscles that push waste through your intestines to slow down.  You may develop hemorrhoids or swollen, bulging veins (varicose veins).  You may have pelvic pain because of the weight gain  and pregnancy hormones relaxing your joints between the bones in your pelvis. Backaches may result from overexertion of the muscles supporting your posture.  You may have changes in your hair. These can include thickening of your hair, rapid growth, and changes in texture. Some women also have hair loss during or after pregnancy, or hair that feels dry or thin. Your hair will most likely return to normal after your baby is born.  Your breasts will continue to grow and be tender. A yellow discharge may leak from your breasts called colostrum.  Your belly button may stick out.  You may feel short of breath because of your expanding uterus.  You  may notice the fetus "dropping," or moving lower in your abdomen.  You may have a bloody mucus discharge. This usually occurs a few days to a week before labor begins.  Your cervix becomes thin and soft (effaced) near your due date. WHAT TO EXPECT AT YOUR PRENATAL EXAMS  You will have prenatal exams every 2 weeks until week 36. Then, you will have weekly prenatal exams. During a routine prenatal visit:  You will be weighed to make sure you and the fetus are growing normally.  Your blood pressure is taken.  Your abdomen will be measured to track your baby's growth.  The fetal heartbeat will be listened to.  Any test results from the previous visit will be discussed.  You may have a cervical check near your due date to see if you have effaced. At around 36 weeks, your caregiver will check your cervix. At the same time, your caregiver will also perform a test on the secretions of the vaginal tissue. This test is to determine if a type of bacteria, Group B streptococcus, is present. Your caregiver will explain this further. Your caregiver may ask you:  What your birth plan is.  How you are feeling.  If you are feeling the baby move.  If you have had any abnormal symptoms, such as leaking fluid, bleeding, severe headaches, or abdominal  cramping.  If you have any questions. Other tests or screenings that may be performed during your third trimester include:  Blood tests that check for low iron levels (anemia).  Fetal testing to check the health, activity level, and growth of the fetus. Testing is done if you have certain medical conditions or if there are problems during the pregnancy. FALSE LABOR You may feel small, irregular contractions that eventually go away. These are called Braxton Hicks contractions, or false labor. Contractions may last for hours, days, or even weeks before true labor sets in. If contractions come at regular intervals, intensify, or become painful, it is best to be seen by your caregiver.  SIGNS OF LABOR   Menstrual-like cramps.  Contractions that are 5 minutes apart or less.  Contractions that start on the top of the uterus and spread down to the lower abdomen and back.  A sense of increased pelvic pressure or back pain.  A watery or bloody mucus discharge that comes from the vagina. If you have any of these signs before the 37th week of pregnancy, call your caregiver right away. You need to go to the hospital to get checked immediately. HOME CARE INSTRUCTIONS   Avoid all smoking, herbs, alcohol, and unprescribed drugs. These chemicals affect the formation and growth of the baby.  Follow your caregiver's instructions regarding medicine use. There are medicines that are either safe or unsafe to take during pregnancy.  Exercise only as directed by your caregiver. Experiencing uterine cramps is a good sign to stop exercising.  Continue to eat regular, healthy meals.  Wear a good support bra for breast tenderness.  Do not use hot tubs, steam rooms, or saunas.  Wear your seat belt at all times when driving.  Avoid raw meat, uncooked cheese, cat litter boxes, and soil used by cats. These carry germs that can cause birth defects in the baby.  Take your prenatal vitamins.  Try taking a  stool softener (if your caregiver approves) if you develop constipation. Eat more high-fiber foods, such as fresh vegetables or fruit and whole grains. Drink plenty of fluids to keep your urine clear  or pale yellow.  Take warm sitz baths to soothe any pain or discomfort caused by hemorrhoids. Use hemorrhoid cream if your caregiver approves.  If you develop varicose veins, wear support hose. Elevate your feet for 15 minutes, 3-4 times a day. Limit salt in your diet.  Avoid heavy lifting, wear low heal shoes, and practice good posture.  Rest a lot with your legs elevated if you have leg cramps or low back pain.  Visit your dentist if you have not gone during your pregnancy. Use a soft toothbrush to brush your teeth and be gentle when you floss.  A sexual relationship may be continued unless your caregiver directs you otherwise.  Do not travel far distances unless it is absolutely necessary and only with the approval of your caregiver.  Take prenatal classes to understand, practice, and ask questions about the labor and delivery.  Make a trial run to the hospital.  Pack your hospital bag.  Prepare the baby's nursery.  Continue to go to all your prenatal visits as directed by your caregiver. SEEK MEDICAL CARE IF:  You are unsure if you are in labor or if your water has broken.  You have dizziness.  You have mild pelvic cramps, pelvic pressure, or nagging pain in your abdominal area.  You have persistent nausea, vomiting, or diarrhea.  You have a bad smelling vaginal discharge.  You have pain with urination. SEEK IMMEDIATE MEDICAL CARE IF:   You have a fever.  You are leaking fluid from your vagina.  You have spotting or bleeding from your vagina.  You have severe abdominal cramping or pain.  You have rapid weight loss or gain.  You have shortness of breath with chest pain.  You notice sudden or extreme swelling of your face, hands, ankles, feet, or legs.  You have  not felt your baby move in over an hour.  You have severe headaches that do not go away with medicine.  You have vision changes. Document Released: 07/19/2001 Document Revised: 07/30/2013 Document Reviewed: 09/25/2012 [facility] Patient Information 2015 [place], [place]. This information is not intended to replace advice given to you by your health care provider. Make sure you discuss any questions you have with your health care provider.

## 2015-04-08 NOTE — Progress Notes (Signed)
  Subjective:    Debra Mccarty is being seen today for her first obstetrical visit. She is at [redacted]w[redacted]d gestation. Her obstetrical history is significant for late to prenatal care. Relationship with FOB: significant other, living together. Patient is undecided about breastfeeding. Pregnancy history fully reviewed.  Patient reports no complaints.  Review of Systems:   Review of Systems  General: no fever/chills Pulmonary: no sob/cough Cardiovascular: no chest pain/palpatations GI: no n/v/d/abdominal pain GU: no dysuria. No vaginal bleeding or LOF OB: positive fetal movement, no contractions  Objective:     BP 117/66 mmHg  Pulse 89  Temp(Src) 97.9 F (36.6 C)  Wt 197 lb 14.4 oz (89.767 kg)  LMP 09/05/2014 Physical Exam  Constitutional: Well appearing patient in no apparent distress.  Resp: lungs clear to auscultation bilaterally Cardio: RRR, no murmur Abdomen: fundal height 29cm; soft & non tender Neck: normal ROM; no lymphadenopathy; trachea midline, no thyromegaly Pelvic exam: cervix normal appearing, visually closed; SVE deferred; small amount of thin white discharge Exam    Assessment:    Pregnancy: G2P0010 Patient Active Problem List   Diagnosis Date Noted  . Encounter for fetal anatomic survey   . Insufficient prenatal care in second trimester 01/01/2015       Plan:     Initial labs drawn. Prenatal vitamins. Problem list reviewed and updated. Role of ultrasound in pregnancy discussed; f/u ultrasound d/t limited views: ordered. Follow up in 2 weeks. Information given regarding preterm labor precautions and purpose of Tdap.    Judeth Horn, NP  04/08/2015

## 2015-04-08 NOTE — Progress Notes (Signed)
New OB packet + 28 week packet given  tdap & flu vaccine discussed with patient, patient declined

## 2015-04-09 LAB — PRESCRIPTION MONITORING PROFILE (19 PANEL)
Amphetamine/Meth: NEGATIVE ng/mL
BARBITURATE SCREEN, URINE: NEGATIVE ng/mL
BUPRENORPHINE, URINE: NEGATIVE ng/mL
Benzodiazepine Screen, Urine: NEGATIVE ng/mL
Cannabinoid Scrn, Ur: NEGATIVE ng/mL
Carisoprodol, Urine: NEGATIVE ng/mL
Cocaine Metabolites: NEGATIVE ng/mL
Creatinine, Urine: 54.77 mg/dL (ref >20.0)
Fentanyl, Ur: NEGATIVE ng/mL
MDMA URINE: NEGATIVE ng/mL
METHAQUALONE SCREEN (URINE): NEGATIVE ng/mL
Meperidine, Ur: NEGATIVE ng/mL
Methadone Screen, Urine: NEGATIVE ng/mL
NITRITES URINE, INITIAL: NEGATIVE ug/mL
OPIATE SCREEN, URINE: NEGATIVE ng/mL
Oxycodone Screen, Ur: NEGATIVE ng/mL
Phencyclidine, Ur: NEGATIVE ng/mL
Propoxyphene: NEGATIVE ng/mL
TAPENTADOLUR: NEGATIVE ng/mL
TRAMADOL UR: NEGATIVE ng/mL
ZOLPIDEM, URINE: NEGATIVE ng/mL
pH, Initial: 7.3 pH (ref 4.5–8.9)

## 2015-04-09 LAB — WET PREP, GENITAL
Trich, Wet Prep: NONE SEEN
Yeast Wet Prep HPF POC: NONE SEEN

## 2015-04-09 LAB — GC/CHLAMYDIA PROBE AMP (~~LOC~~) NOT AT ARMC
CHLAMYDIA, DNA PROBE: NEGATIVE
Neisseria Gonorrhea: NEGATIVE

## 2015-04-09 LAB — CYTOLOGY - PAP

## 2015-04-09 LAB — CULTURE, OB URINE
COLONY COUNT: NO GROWTH
Organism ID, Bacteria: NO GROWTH

## 2015-04-09 LAB — HIV ANTIBODY (ROUTINE TESTING W REFLEX): HIV 1&2 Ab, 4th Generation: NONREACTIVE

## 2015-04-09 NOTE — Addendum Note (Signed)
Addended by: Kathee Delton on: 04/09/2015 08:52 AM   Modules accepted: Orders

## 2015-04-17 ENCOUNTER — Other Ambulatory Visit: Payer: Self-pay | Admitting: Student

## 2015-04-17 ENCOUNTER — Ambulatory Visit (HOSPITAL_COMMUNITY)
Admission: RE | Admit: 2015-04-17 | Discharge: 2015-04-17 | Disposition: A | Discharge status: Home / Self Care | Payer: Medicaid Other | Source: Ambulatory Visit | Attending: Physician Assistant | Admitting: Physician Assistant

## 2015-04-17 DIAGNOSIS — Z0489 Encounter for examination and observation for other specified reasons: Secondary | ICD-10-CM

## 2015-04-17 DIAGNOSIS — Z3A32 32 weeks gestation of pregnancy: Secondary | ICD-10-CM

## 2015-04-17 DIAGNOSIS — O0933 Supervision of pregnancy with insufficient antenatal care, third trimester: Secondary | ICD-10-CM | POA: Diagnosis present

## 2015-04-17 DIAGNOSIS — Z3493 Encounter for supervision of normal pregnancy, unspecified, third trimester: Secondary | ICD-10-CM

## 2015-04-17 DIAGNOSIS — IMO0002 Reserved for concepts with insufficient information to code with codable children: Secondary | ICD-10-CM

## 2015-04-24 ENCOUNTER — Encounter: Payer: Medicaid Other | Admitting: Family Medicine

## 2015-04-29 ENCOUNTER — Encounter: Payer: Medicaid Other | Admitting: Obstetrics and Gynecology

## 2015-05-13 ENCOUNTER — Encounter: Payer: Medicaid Other | Admitting: Certified Nurse Midwife

## 2015-05-20 ENCOUNTER — Other Ambulatory Visit (HOSPITAL_COMMUNITY)
Admission: RE | Admit: 2015-05-20 | Discharge: 2015-05-20 | Disposition: A | Discharge status: Home / Self Care | Payer: Medicaid Other | Source: Ambulatory Visit | Attending: Certified Nurse Midwife | Admitting: Certified Nurse Midwife

## 2015-05-20 ENCOUNTER — Ambulatory Visit (INDEPENDENT_AMBULATORY_CARE_PROVIDER_SITE_OTHER): Payer: Medicaid Other | Admitting: Certified Nurse Midwife

## 2015-05-20 VITALS — BP 124/65 | HR 90 | Temp 98.3°F | Wt 210.2 lb

## 2015-05-20 DIAGNOSIS — Z113 Encounter for screening for infections with a predominantly sexual mode of transmission: Secondary | ICD-10-CM | POA: Insufficient documentation

## 2015-05-20 DIAGNOSIS — Z3493 Encounter for supervision of normal pregnancy, unspecified, third trimester: Secondary | ICD-10-CM | POA: Diagnosis present

## 2015-05-20 LAB — POCT URINALYSIS DIP (DEVICE)
Bilirubin Urine: NEGATIVE
Glucose, UA: NEGATIVE mg/dL
Hgb urine dipstick: NEGATIVE
Ketones, ur: 15 mg/dL — AB
Nitrite: NEGATIVE
Protein, ur: NEGATIVE mg/dL
Specific Gravity, Urine: 1.015 (ref 1.005–1.030)
Urobilinogen, UA: 0.2 mg/dL (ref 0.0–1.0)
pH: 7 (ref 5.0–8.0)

## 2015-05-20 LAB — OB RESULTS CONSOLE GC/CHLAMYDIA: GC PROBE AMP, GENITAL: NEGATIVE

## 2015-05-20 LAB — OB RESULTS CONSOLE GBS: GBS: POSITIVE

## 2015-05-20 NOTE — Patient Instructions (Signed)
Group B streptococcus (GBS) is a type of bacteria often found in healthy women. GBS is not the same as the bacteria that causes strep throat. You may have GBS in your vagina, rectum, or bladder. GBS does not spread through sexual contact, but it can be passed to a baby during childbirth. This can be dangerous for your baby. It is not dangerous to you and usually does not cause any symptoms. Your health care provider may test you for GBS when your pregnancy is between 35 and 37 weeks. GBS is dangerous only during birth, so there is no need to test for it earlier. It is possible to have GBS during pregnancy and never pass it to your baby. If your test results are positive for GBS, your health care provider may recommend giving you antibiotic medicine during delivery to make sure your baby stays healthy. RISK FACTORS You are more likely to pass GBS to your baby if:   Your water breaks (ruptured membrane) or you go into labor before 37 weeks.  Your water breaks 18 hours before you deliver.  You passed GBS during a previous pregnancy.  You have a urinary tract infection caused by GBS any time during pregnancy.  You have a fever during labor. SYMPTOMS Most women who have GBS do not have any symptoms. If you have a urinary tract infection caused by GBS, you might have frequent or painful urination and fever. Babies who get GBS usually show symptoms within 7 days of birth. Symptoms may include:   Breathing problems.  Heart and blood pressure problems.  Digestive and kidney problems. DIAGNOSIS Routine screening for GBS is recommended for all pregnant women. A health care provider takes a sample of the fluid in your vagina and rectum with a swab. It is then sent to a lab to be checked for GBS. A sample of your urine may also be checked for the bacteria.  TREATMENT If you test positive for GBS, you may need treatment with an antibiotic medicine during labor. As soon as you go into labor, or as soon as  your membranes rupture, you will get the antibiotic medicine through an IV access. You will continue to get the medicine until after you give birth. You do not need antibiotic medicine if you are having a cesarean delivery.If your baby shows signs or symptoms of GBS after birth, your baby can also be treated with an antibiotic medicine. HOME CARE INSTRUCTIONS   Take all antibiotic medicine as prescribed by your health care provider. Only take medicine as directed.   Continue with prenatal visits and care.   Keep all follow-up appointments.  SEEK MEDICAL CARE IF:   You have pain when you urinate.   You have to urinate frequently.   You have a fever.  SEEK IMMEDIATE MEDICAL CARE IF:   Your membranes rupture.  You go into labor.   This information is not intended to replace advice given to you by your health care provider. Make sure you discuss any questions you have with your health care provider.   Document Released: 11/01/2007 Document Revised: 07/30/2013 Document Reviewed: 05/17/2013 Elsevier Interactive Patient Education 2016 [employer] Inc[employer]  

## 2015-05-20 NOTE — Progress Notes (Signed)
Breastfeeding tip of the week reviewed Declined flu vaccine 

## 2015-05-20 NOTE — Progress Notes (Signed)
Subjective:  Debra ColumbusCarolann Mccarty is a 24 y.o. G2P0010 at 6810w5d being seen today for ongoing prenatal care.  Patient reports no complaints.  Contractions: Not present.  Vag. Bleeding: None. Movement: Present. Denies leaking of fluid.   The following portions of the patient's history were reviewed and updated as appropriate: allergies, current medications, past family history, past medical history, past social history, past surgical history and problem list. Problem list updated.  Objective:   Filed Vitals:   05/20/15 0936  BP: 124/65  Pulse: 90  Temp: 98.3 F (36.8 C)  Weight: 210 lb 3.2 oz (95.346 kg)    Fetal Status: Fetal Heart Rate (bpm): 140   Movement: Present     General:  Alert, oriented and cooperative. Patient is in no acute distress.  Skin: Skin is warm and dry. No rash noted.   Cardiovascular: Normal heart rate noted  Respiratory: Normal respiratory effort, no problems with respiration noted  Abdomen: Soft, gravid, appropriate for gestational age. Pain/Pressure: Absent     Pelvic: Vag. Bleeding: None     Cervical exam performed      ft/thick/soft  Extremities: Normal range of motion.  Edema: Trace  Mental Status: Normal mood and affect. Normal behavior. Normal judgment and thought content.   Urinalysis: Urine Protein: Negative Urine Glucose: Negative  Assessment and Plan:  Pregnancy: G2P0010 at 6010w5d  1. Prenatal care in third trimester  - Culture, beta strep (group b only) - GC[facility]Chlamydia probe amp [facility]  Preterm labor symptoms and general obstetric precautions including but not limited to vaginal bleeding, contractions, leaking of fluid and fetal movement were reviewed in detail with the patient. Please refer to After Visit Summary for other counseling recommendations.  Return in about 1 week (around 05/27/2015).   Rhea PinkLori A Clemmons, CNM

## 2015-05-21 ENCOUNTER — Inpatient Hospital Stay (HOSPITAL_COMMUNITY)
Admission: AD | Admit: 2015-05-21 | Discharge: 2015-05-21 | Disposition: A | Discharge status: Home / Self Care | Payer: Medicaid Other | Source: Ambulatory Visit | Attending: Obstetrics & Gynecology | Admitting: Obstetrics & Gynecology

## 2015-05-21 ENCOUNTER — Encounter (HOSPITAL_COMMUNITY): Payer: Self-pay | Admitting: *Deleted

## 2015-05-21 DIAGNOSIS — O4693 Antepartum hemorrhage, unspecified, third trimester: Secondary | ICD-10-CM | POA: Diagnosis not present

## 2015-05-21 DIAGNOSIS — Z3A36 36 weeks gestation of pregnancy: Secondary | ICD-10-CM | POA: Insufficient documentation

## 2015-05-21 DIAGNOSIS — Z87891 Personal history of nicotine dependence: Secondary | ICD-10-CM | POA: Insufficient documentation

## 2015-05-21 LAB — WET PREP, GENITAL
Trich, Wet Prep: NONE SEEN
YEAST WET PREP: NONE SEEN

## 2015-05-21 LAB — URINALYSIS, ROUTINE W REFLEX MICROSCOPIC
BILIRUBIN URINE: NEGATIVE
Glucose, UA: NEGATIVE mg/dL
Hgb urine dipstick: NEGATIVE
Ketones, ur: NEGATIVE mg/dL
NITRITE: NEGATIVE
PH: 7.5 (ref 5.0–8.0)
Protein, ur: NEGATIVE mg/dL
SPECIFIC GRAVITY, URINE: 1.015 (ref 1.005–1.030)
Urobilinogen, UA: 0.2 mg/dL (ref 0.0–1.0)

## 2015-05-21 LAB — URINE MICROSCOPIC-ADD ON

## 2015-05-21 LAB — CULTURE, BETA STREP (GROUP B ONLY)

## 2015-05-21 LAB — GC/CHLAMYDIA PROBE AMP (~~LOC~~) NOT AT ARMC
Chlamydia: NEGATIVE
Neisseria Gonorrhea: NEGATIVE

## 2015-05-21 NOTE — MAU Note (Signed)
Noted blood after urnination; when wiped and in toilet.  Denies any hx of low lying placenta or previa, no recent intercourse.  Did have SVE yesterday.

## 2015-05-21 NOTE — Discharge Instructions (Signed)
Vaginal Bleeding During Pregnancy, Third Trimester °A small amount of bleeding (spotting) from the vagina is relatively common in pregnancy. Various things can cause bleeding or spotting in pregnancy. Sometimes the bleeding is normal and is not a problem. However, bleeding during the third trimester can also be a sign of something serious for the mother and the baby. Be sure to tell your health care provider about any vaginal bleeding right away.  °Some possible causes of vaginal bleeding during the third trimester include:  °· The placenta may be partially or completely covering the opening to the cervix (placenta previa).   °· The placenta may have separated from the uterus (abruption of the placenta).   °· There may be an infection or growth on the cervix.   °· You may be starting labor, called discharging of the mucus plug.   °· The placenta may grow into the muscle layer of the uterus (placenta accreta).   °HOME CARE INSTRUCTIONS  °Watch your condition for any changes. The following actions may help to lessen any discomfort you are feeling:  °· Follow your health care provider's instructions for limiting your activity. If your health care provider orders bed rest, you may need to stay in bed and only get up to use the bathroom. However, your health care provider may allow you to continue light activity. °· If needed, make plans for someone to help with your regular activities and responsibilities while you are on bed rest. °· Keep track of the number of pads you use each day, how often you change pads, and how soaked (saturated) they are. Write this down. °· Do not use tampons. Do not douche. °· Do not have sexual intercourse or orgasms until approved by your health care provider. °· Follow your health care provider's advice about lifting, driving, and physical activities. °· If you pass any tissue from your vagina, save the tissue so you can show it to your health care provider.   °· Only take over-the-counter  or prescription medicines as directed by your health care provider. °· Do not take aspirin because it can make you bleed.   °· Keep all follow-up appointments as directed by your health care provider. °SEEK MEDICAL CARE IF: °· You have any vaginal bleeding during any part of your pregnancy. °· You have cramps or labor pains. °· You have a fever, not controlled by medicine. °SEEK IMMEDIATE MEDICAL CARE IF:  °· You have severe cramps or pain in your back or belly (abdomen). °· You have chills. °· You have a gush of fluid from the vagina. °· You pass large clots or tissue from your vagina. °· Your bleeding increases. °· You feel light-headed or weak. °· You pass out. °· You feel less movement or no movement of the baby.   °MAKE SURE YOU: °· Understand these instructions. °· Will watch your condition. °· Will get help right away if you are not doing well or get worse. °  °This information is not intended to replace advice given to you by your health care provider. Make sure you discuss any questions you have with your health care provider. °  °Document Released: 10/15/2002 Document Revised: 07/30/2013 Document Reviewed: 04/01/2013 °Elsevier Interactive Patient Education ©2016 Elsevier Inc. ° °

## 2015-05-21 NOTE — MAU Provider Note (Signed)
Chief Complaint:  Vaginal Bleeding   First Provider Initiated Contact with Patient 05/21/15 1558      HPI: Debra Mccarty is a 24 y.o. G2P0010 at 6340w6d [facility] who presents to maternity admissions reporting single episode of vaginal bleeding in toilet and on tissue after having bowel movement and urinating at home.  She has been to the bathroom since this episode and has seen no further bleeding.  She had cervical exam in MAU yesterday and was noted to be closed but soft.   She reports good fetal movement, denies regular contractions, LOF, vaginal itching/burning, urinary symptoms, h/a, dizziness, n/v, or fever/chills.    Vaginal Bleeding The patient's primary symptoms include vaginal bleeding. The patient's pertinent negatives include no pelvic pain or vaginal discharge. This is a new problem. The current episode started today. The problem occurs intermittently. The problem has been resolved. The patient is experiencing no pain. She is pregnant. Associated symptoms include frequency. Pertinent negatives include no abdominal pain, chills, constipation, diarrhea, dysuria, fever, flank pain, headaches, nausea, urgency or vomiting. The vaginal bleeding is lighter than menses. She has not been passing clots. She has not been passing tissue. The symptoms are aggravated by bowel movements. She has tried nothing for the symptoms.    Past Medical History: Past Medical History  Diagnosis Date  . Anemia     Past obstetric history: OB History  Gravida Para Term Preterm AB SAB TAB Ectopic Multiple Living  2 0 0 0 1 0 1 0 0 0     # Outcome Date GA Lbr Len/2nd Weight Sex Delivery Anes PTL Lv  2 Current           1 TAB               Past Surgical History: Past Surgical History  Procedure Laterality Date  . No past surgeries      Family History: Family History  Problem Relation Age of Onset  . Hypertension Maternal Grandmother   . Arthritis Paternal Grandmother     Social  History: Social History  Substance Use Topics  . Smoking status: Former Games developermoker  . Smokeless tobacco: Never Used  . Alcohol Use: No    Allergies: No Known Allergies  Meds:  No prescriptions prior to admission    ROS:  Review of Systems  Constitutional: Negative for fever, chills and fatigue.  HENT: Negative for sinus pressure.   Eyes: Negative for photophobia.  Respiratory: Negative for shortness of breath.   Cardiovascular: Negative for chest pain.  Gastrointestinal: Negative for nausea, vomiting, abdominal pain, diarrhea and constipation.  Genitourinary: Positive for frequency and vaginal bleeding. Negative for dysuria, urgency, flank pain, vaginal discharge, difficulty urinating, vaginal pain and pelvic pain.  Musculoskeletal: Negative for neck pain.  Neurological: Negative for dizziness, weakness and headaches.  Psychiatric/Behavioral: Negative.      I have reviewed patient's Past Medical Hx, Surgical Hx, Family Hx, Social Hx, medications and allergies.   Physical Exam   Patient Vitals for the past 24 hrs:  BP Temp Temp src Pulse Resp Weight  05/21/15 1700 122/73 mmHg 98.4 F (36.9 C) Oral 87 16 -  05/21/15 1507 112/65 mmHg 98.4 F (36.9 C) Oral 85 16 95.528 kg (210 lb 9.6 oz)   Constitutional: Well-developed, well-nourished female in no acute distress.  Cardiovascular: normal rate Respiratory: normal effort GI: Abd soft, non-tender, gravid appropriate for gestational age.  MS: Extremities nontender, no edema, normal ROM Neurologic: Alert and oriented x 4.  GU: Neg  CVAT.  PELVIC EXAM: Cervix pink, visually closed, with some mild erythema and slightly friable to cotton swab, moderate amount thin white frothy discharge, no active bleeding noted, vaginal walls and external genitalia normal     FHT:  Baseline 130 , moderate variability, accelerations present, no decelerations Contractions: rare, mild to palpation   Labs: Results for orders placed or performed  during the hospital encounter of 05/21/15 (from the past 24 hour(s))  Urinalysis, Routine w reflex microscopic (not at [facility])     Status: Abnormal   Collection Time: 05/21/15  3:15 PM  Result Value Ref Range   Color, Urine YELLOW YELLOW   APPearance CLEAR CLEAR   Specific Gravity, Urine 1.015 1.005 - 1.030   pH 7.5 5.0 - 8.0   Glucose, UA NEGATIVE NEGATIVE mg/dL   Hgb urine dipstick NEGATIVE NEGATIVE   Bilirubin Urine NEGATIVE NEGATIVE   Ketones, ur NEGATIVE NEGATIVE mg/dL   Protein, ur NEGATIVE NEGATIVE mg/dL   Urobilinogen, UA 0.2 0.0 - 1.0 mg/dL   Nitrite NEGATIVE NEGATIVE   Leukocytes, UA TRACE (A) NEGATIVE  Urine microscopic-add on     Status: Abnormal   Collection Time: 05/21/15  3:15 PM  Result Value Ref Range   Squamous Epithelial / LPF FEW (A) RARE   WBC, UA 0-2 <3 WBC/hpf  Wet prep, genital     Status: Abnormal   Collection Time: 05/21/15  4:05 PM  Result Value Ref Range   Yeast Wet Prep HPF POC NONE SEEN NONE SEEN   Trich, Wet Prep NONE SEEN NONE SEEN   Clue Cells Wet Prep HPF POC FEW (A) NONE SEEN   WBC, Wet Prep HPF POC MODERATE (A) NONE SEEN   --/--/O POS (03/29 1403)   MAU Course/MDM: I have ordered labs and reviewed results.  Reviewed FHR tracing.  Pelvic exam with evidence of friable cervix but no visible bleeding.  FHR tracing reactive.  Likely source of bleeding from cervix related to recent pelvic exam.  Pt stable at time of discharge.  Assessment: 1. Vaginal bleeding in pregnancy, third trimester     Plan: Discharge home with bleeding precautions Labor precautions and fetal kick counts      Follow-up Information    Follow up with [facility].   Specialty:  Obstetrics and Gynecology   Why:  As scheduled on Friday   Contact information:   [address] [place] [address] [phone]      Follow up with [facility] [place] [facility]ERN[facility] AD[facility].   Why:  As needed for emergencies   Contact  information:   [address] [id number] mc [place] [address] [phone]       Medication List    TAKE these medications        CONCEPT OB 130-92.4-1 MG Caps  Take 1 tablet by mouth daily.     loratadine 10 MG tablet  Commonly known as:  CLARITIN  Take 10 mg by mouth daily as needed for allergies.        Sharen Counter Certified Nurse-Midwife 05/21/2015 5:09 PM

## 2015-05-29 ENCOUNTER — Ambulatory Visit (INDEPENDENT_AMBULATORY_CARE_PROVIDER_SITE_OTHER): Payer: Medicaid Other | Admitting: Family Medicine

## 2015-05-29 ENCOUNTER — Encounter: Payer: Self-pay | Admitting: Family Medicine

## 2015-05-29 VITALS — BP 116/70 | HR 89 | Temp 98.6°F | Wt 208.8 lb

## 2015-05-29 DIAGNOSIS — O0932 Supervision of pregnancy with insufficient antenatal care, second trimester: Secondary | ICD-10-CM | POA: Diagnosis present

## 2015-05-29 LAB — POCT URINALYSIS DIP (DEVICE)
Bilirubin Urine: NEGATIVE
Glucose, UA: NEGATIVE mg/dL
Hgb urine dipstick: NEGATIVE
Ketones, ur: NEGATIVE mg/dL
Nitrite: NEGATIVE
Protein, ur: NEGATIVE mg/dL
Specific Gravity, Urine: 1.015 (ref 1.005–1.030)
Urobilinogen, UA: 0.2 mg/dL (ref 0.0–1.0)
pH: 7 (ref 5.0–8.0)

## 2015-05-29 NOTE — Progress Notes (Signed)
Breastfeeding tip of the week reviewed. 

## 2015-05-29 NOTE — Patient Instructions (Signed)

## 2015-05-29 NOTE — Progress Notes (Signed)
Subjective:  Debra Mccarty is a 24 y.o. G2P0010 at 8553w0d being seen today for ongoing prenatal care.  Patient reports no complaints.  Contractions: Not present.  Vag. Bleeding: None. Movement: Present. Denies leaking of fluid.   The following portions of the patient's history were reviewed and updated as appropriate: allergies, current medications, past family history, past medical history, past social history, past surgical history and problem list. Problem list updated.  Objective:   Filed Vitals:   05/29/15 0853  BP: 116/70  Pulse: 89  Temp: 98.6 F (37 C)  Weight: 208 lb 12.8 oz (94.711 kg)    Fetal Status: Fetal Heart Rate (bpm): 138   Movement: Present     General:  Alert, oriented and cooperative. Patient is in no acute distress.  Skin: Skin is warm and dry. No rash noted.   Cardiovascular: Normal heart rate noted  Respiratory: Normal respiratory effort, no problems with respiration noted  Abdomen: Soft, gravid, appropriate for gestational age. Pain/Pressure: Absent     Pelvic: Vag. Bleeding: None     Cervical exam deferred        Extremities: Normal range of motion.  Edema: None  Mental Status: Normal mood and affect. Normal behavior. Normal judgment and thought content.   Urinalysis: Urine Protein: Negative Urine Glucose: Negative  Assessment and Plan:  Pregnancy: G2P0010 at 5353w0d  1. Insufficient prenatal care in second trimester FHT normal.  FH normal.  Term labor symptoms and general obstetric precautions including but not limited to vaginal bleeding, contractions, leaking of fluid and fetal movement were reviewed in detail with the patient. Please refer to After Visit Summary for other counseling recommendations.  No Follow-up on file.   Levie HeritageJacob J Stinson, DO

## 2015-06-05 ENCOUNTER — Ambulatory Visit (INDEPENDENT_AMBULATORY_CARE_PROVIDER_SITE_OTHER): Payer: Self-pay | Admitting: Family Medicine

## 2015-06-05 VITALS — BP 130/70 | HR 91 | Temp 98.6°F | Wt 208.0 lb

## 2015-06-05 DIAGNOSIS — O0932 Supervision of pregnancy with insufficient antenatal care, second trimester: Secondary | ICD-10-CM

## 2015-06-05 LAB — POCT URINALYSIS DIP (DEVICE)
BILIRUBIN URINE: NEGATIVE
GLUCOSE, UA: NEGATIVE mg/dL
Hgb urine dipstick: NEGATIVE
KETONES UR: 15 mg/dL — AB
Nitrite: NEGATIVE
PH: 7 (ref 5.0–8.0)
Protein, ur: NEGATIVE mg/dL
SPECIFIC GRAVITY, URINE: 1.015 (ref 1.005–1.030)
Urobilinogen, UA: 0.2 mg/dL (ref 0.0–1.0)

## 2015-06-05 NOTE — Progress Notes (Signed)
Attempted to review BF education with patient, who states I'm not breastfeeding and they told me that already  15 ketones, small leuks on UA

## 2015-06-05 NOTE — Progress Notes (Signed)
Subjective:  Debra ColumbusCarolann Mccarty is a 24 y.o. G2P0010 at 4553w0d being seen today for ongoing prenatal care.  Patient reports no complaints.  Contractions: Not present.  Vag. Bleeding: None. Movement: Present. Denies leaking of fluid.   The following portions of the patient's history were reviewed and updated as appropriate: allergies, current medications, past family history, past medical history, past social history, past surgical history and problem list. Problem list updated.  Objective:   Filed Vitals:   06/05/15 0936  BP: 130/70  Pulse: 91  Temp: 98.6 F (37 C)  Weight: 208 lb (94.348 kg)    Fetal Status: Fetal Heart Rate (bpm): 133   Movement: Present     General:  Alert, oriented and cooperative. Patient is in no acute distress.  Skin: Skin is warm and dry. No rash noted.   Cardiovascular: Normal heart rate noted  Respiratory: Normal respiratory effort, no problems with respiration noted  Abdomen: Soft, gravid, appropriate for gestational age. Pain/Pressure: Present     Pelvic: Vag. Bleeding: None     Cervical exam deferred        Extremities: Normal range of motion.  Edema: None  Mental Status: Normal mood and affect. Normal behavior. Normal judgment and thought content.   Urinalysis: Urine Protein: Negative Urine Glucose: Negative  Assessment and Plan:  Pregnancy: G2P0010 at 5753w0d  1. Insufficient prenatal care in second trimester FHT normal, fundal height normal  Term labor symptoms and general obstetric precautions including but not limited to vaginal bleeding, contractions, leaking of fluid and fetal movement were reviewed in detail with the patient. Please refer to After Visit Summary for other counseling recommendations.  No Follow-up on file.   Levie HeritageJacob J Stinson, DO

## 2015-06-05 NOTE — Patient Instructions (Signed)

## 2015-06-09 ENCOUNTER — Ambulatory Visit (INDEPENDENT_AMBULATORY_CARE_PROVIDER_SITE_OTHER): Payer: Medicaid Other | Admitting: Obstetrics and Gynecology

## 2015-06-09 VITALS — BP 126/63 | HR 78 | Temp 98.6°F | Wt 212.7 lb

## 2015-06-09 DIAGNOSIS — O0932 Supervision of pregnancy with insufficient antenatal care, second trimester: Secondary | ICD-10-CM

## 2015-06-09 LAB — POCT URINALYSIS DIP (DEVICE)
Bilirubin Urine: NEGATIVE
Glucose, UA: NEGATIVE mg/dL
HGB URINE DIPSTICK: NEGATIVE
KETONES UR: NEGATIVE mg/dL
Nitrite: NEGATIVE
PH: 7 (ref 5.0–8.0)
PROTEIN: NEGATIVE mg/dL
SPECIFIC GRAVITY, URINE: 1.015 (ref 1.005–1.030)
UROBILINOGEN UA: 0.2 mg/dL (ref 0.0–1.0)

## 2015-06-09 NOTE — Progress Notes (Signed)
Subjective:  Debra ColumbusCarolann Mccarty is a 24 y.o. G2P0010 at 5788w4d being seen today for ongoing prenatal care.  Patient reports no complaints.  Contractions: Not present.  Vag. Bleeding: None. Movement: Present. Denies leaking of fluid.   The following portions of the patient's history were reviewed and updated as appropriate: allergies, current medications, past family history, past medical history, past social history, past surgical history and problem list. Problem list updated.  Objective:   Filed Vitals:   06/09/15 1151  BP: 126/63  Pulse: 78  Temp: 98.6 F (37 C)  Weight: 212 lb 11.2 oz (96.48 kg)    Fetal Status: Fetal Heart Rate (bpm): 142 Fundal Height: 41 cm Movement: Present  Presentation: Vertex  General:  Alert, oriented and cooperative. Patient is in no acute distress.  Skin: Skin is warm and dry. No rash noted.   Cardiovascular: Normal heart rate noted  Respiratory: Normal respiratory effort, no problems with respiration noted  Abdomen: Soft, gravid, appropriate for gestational age. Pain/Pressure: Present     Pelvic: Vag. Bleeding: None     Cervical exam deferred        Extremities: Normal range of motion.  Edema: None  Mental Status: Normal mood and affect. Normal behavior. Normal judgment and thought content.   Urinalysis:      Assessment and Plan:  Pregnancy: G2P0010 at 6388w4d  1. Insufficient prenatal care in second trimester - gbs positive, nkda, explained to pt - ABO - Antibody screen  Term labor symptoms and general obstetric precautions including but not limited to vaginal bleeding, contractions, leaking of fluid and fetal movement were reviewed in detail with the patient. Please refer to After Visit Summary for other counseling recommendations.  Return in about 1 week (around 06/16/2015).   Kathrynn RunningNoah Bedford Luanna Weesner, MD

## 2015-06-09 NOTE — Progress Notes (Signed)
Bedside US performed to assess fetal presentation - vertex confirmed.

## 2015-06-10 LAB — ABO

## 2015-06-10 LAB — ANTIBODY SCREEN: Antibody Screen: NEGATIVE

## 2015-06-16 ENCOUNTER — Telehealth (HOSPITAL_COMMUNITY): Payer: Self-pay | Admitting: *Deleted

## 2015-06-16 ENCOUNTER — Ambulatory Visit (INDEPENDENT_AMBULATORY_CARE_PROVIDER_SITE_OTHER): Payer: Medicaid Other | Admitting: Advanced Practice Midwife

## 2015-06-16 ENCOUNTER — Encounter: Payer: Self-pay | Admitting: Advanced Practice Midwife

## 2015-06-16 VITALS — BP 114/65 | HR 83 | Wt 210.2 lb

## 2015-06-16 DIAGNOSIS — O480 Post-term pregnancy: Secondary | ICD-10-CM

## 2015-06-16 LAB — POCT URINALYSIS DIP (DEVICE)
Bilirubin Urine: NEGATIVE
Glucose, UA: NEGATIVE mg/dL
HGB URINE DIPSTICK: NEGATIVE
Ketones, ur: NEGATIVE mg/dL
NITRITE: NEGATIVE
PH: 7 (ref 5.0–8.0)
PROTEIN: NEGATIVE mg/dL
Specific Gravity, Urine: 1.015 (ref 1.005–1.030)
UROBILINOGEN UA: 1 mg/dL (ref 0.0–1.0)

## 2015-06-16 NOTE — Telephone Encounter (Signed)
Preadmission screen  

## 2015-06-16 NOTE — Progress Notes (Signed)
Pt plans to bottle feed.  Desires IOL @ 41 wks - scheduled 11/11 @ 0630.  Labor sx reviewed.

## 2015-06-16 NOTE — Progress Notes (Signed)
Subjective:  Debra ColumbusCarolann Mccarty is a 24 y.o. G2P0010 at 6742w4d being seen today for ongoing prenatal care.  Patient reports no complaints.  Contractions: Not present.  Vag. Bleeding: None. Movement: Present. Denies leaking of fluid.   The following portions of the patient's history were reviewed and updated as appropriate: allergies, current medications, past family history, past medical history, past social history, past surgical history and problem list. Problem list updated.  Objective:   Filed Vitals:   06/16/15 0945  BP: 114/65  Pulse: 83  Weight: 210 lb 3.2 oz (95.346 kg)    Fetal Status: Fetal Heart Rate (bpm): NST   Movement: Present  Presentation: Vertex  General:  Alert, oriented and cooperative. Patient is in no acute distress.  Skin: Skin is warm and dry. No rash noted.   Cardiovascular: Normal heart rate noted  Respiratory: Normal respiratory effort, no problems with respiration noted  Abdomen: Soft, gravid, appropriate for gestational age. Pain/Pressure: Present     Pelvic: Vag. Bleeding: None     Cervical exam deferred        Extremities: Normal range of motion.  Edema: None  Mental Status: Normal mood and affect. Normal behavior. Normal judgment and thought content.   Urinalysis: Urine Protein: Negative Urine Glucose: Negative  Assessment and Plan:  Pregnancy: G2P0010 at 1442w4d  1. Post term pregnancy, antepartum condition or complication -NST reactive - Amniotic fluid index with NST  Term labor symptoms and general obstetric precautions including but not limited to vaginal bleeding, contractions, leaking of fluid and fetal movement were reviewed in detail with the patient. Please refer to After Visit Summary for other counseling recommendations.  Return in about 6 weeks (around 07/28/2015) for PP visit.. IOL on 11/11.   Dorathy KinsmanVirginia Makya Phillis, CNM

## 2015-06-16 NOTE — Patient Instructions (Signed)
Labor Induction Labor induction is when steps are taken to cause a pregnant woman to begin the labor process. Most women go into labor on their own between 37 weeks and 42 weeks of the pregnancy. When this does not happen or when there is a medical need, methods may be used to induce labor. Labor induction causes a pregnant woman's uterus to contract. It also causes the cervix to soften (ripen), open (dilate), and thin out (efface). Usually, labor is not induced before 39 weeks of the pregnancy unless there is a problem with the baby or mother.  Before inducing labor, your health care provider will consider a number of factors, including the following:  The medical condition of you and the baby.   How many weeks along you are.   The status of the baby's lung maturity.   The condition of the cervix.   The position of the baby.  WHAT ARE THE REASONS FOR LABOR INDUCTION? Labor may be induced for the following reasons:  The health of the baby or mother is at risk.   The pregnancy is overdue by 1 week or more.   The water breaks but labor does not start on its own.   The mother has a health condition or serious illness, such as high blood pressure, infection, placental abruption, or diabetes.  The amniotic fluid amounts are low around the baby.   The baby is distressed.  Convenience or wanting the baby to be born on a certain date is not a reason for inducing labor. WHAT METHODS ARE USED FOR LABOR INDUCTION? Several methods of labor induction may be used, such as:   Prostaglandin medicine. This medicine causes the cervix to dilate and ripen. The medicine will also start contractions. It can be taken by mouth or by inserting a suppository into the vagina.   Inserting a thin tube (catheter) with a balloon on the end into the vagina to dilate the cervix. Once inserted, the balloon is expanded with water, which causes the cervix to open.   Stripping the membranes. Your health  care provider separates amniotic sac tissue from the cervix, causing the cervix to be stretched and causing the release of a hormone called progesterone. This may cause the uterus to contract. It is often done during an office visit. You will be sent home to wait for the contractions to begin. You will then come in for an induction.   Breaking the water. Your health care provider makes a hole in the amniotic sac using a small instrument. Once the amniotic sac breaks, contractions should begin. This may still take hours to see an effect.   Medicine to trigger or strengthen contractions. This medicine is given through an IV access tube inserted into a vein in your arm.  All of the methods of induction, besides stripping the membranes, will be done in the hospital. Induction is done in the hospital so that you and the baby can be carefully monitored.  HOW LONG DOES IT TAKE FOR LABOR TO BE INDUCED? Some inductions can take up to 2-3 days. Depending on the cervix, it usually takes less time. It takes longer when you are induced early in the pregnancy or if this is your first pregnancy. If a mother is still pregnant and the induction has been going on for 2-3 days, either the mother will be sent home or a cesarean delivery will be needed. WHAT ARE THE RISKS ASSOCIATED WITH LABOR INDUCTION? Some of the risks of induction include:     Changes in fetal heart rate, such as too high, too low, or erratic.   Fetal distress.   Chance of infection for the mother and baby.   Increased chance of having a cesarean delivery.   Breaking off (abruption) of the placenta from the uterus (rare).   Uterine rupture (very rare).  When induction is needed for medical reasons, the benefits of induction may outweigh the risks. WHAT ARE SOME REASONS FOR NOT INDUCING LABOR? Labor induction should not be done if:   It is shown that your baby does not tolerate labor.   You have had previous surgeries on your  uterus, such as a myomectomy or the removal of fibroids.   Your placenta lies very low in the uterus and blocks the opening of the cervix (placenta previa).   Your baby is not in a head-down position.   The umbilical cord drops down into the birth canal in front of the baby. This could cut off the baby's blood and oxygen supply.   You have had a previous cesarean delivery.   There are unusual circumstances, such as the baby being extremely premature.    This information is not intended to replace advice given to you by your health care provider. Make sure you discuss any questions you have with your health care provider.   Document Released: 12/14/2006 Document Revised: 08/15/2014 Document Reviewed: 02/21/2013 Elsevier Interactive Patient Education 2016 [employer] Inc.  

## 2015-06-19 ENCOUNTER — Encounter (HOSPITAL_COMMUNITY): Payer: Self-pay

## 2015-06-19 ENCOUNTER — Inpatient Hospital Stay (HOSPITAL_COMMUNITY)
Admission: RE | Admit: 2015-06-19 | Discharge: 2015-06-23 | DRG: 774 | Disposition: A | Discharge status: Home / Self Care | Payer: Medicaid Other | Source: Ambulatory Visit | Attending: Obstetrics & Gynecology | Admitting: Obstetrics & Gynecology

## 2015-06-19 DIAGNOSIS — O480 Post-term pregnancy: Secondary | ICD-10-CM | POA: Diagnosis present

## 2015-06-19 DIAGNOSIS — Z8249 Family history of ischemic heart disease and other diseases of the circulatory system: Secondary | ICD-10-CM | POA: Diagnosis not present

## 2015-06-19 DIAGNOSIS — Z8261 Family history of arthritis: Secondary | ICD-10-CM

## 2015-06-19 DIAGNOSIS — Z3A41 41 weeks gestation of pregnancy: Secondary | ICD-10-CM | POA: Diagnosis not present

## 2015-06-19 DIAGNOSIS — Z87891 Personal history of nicotine dependence: Secondary | ICD-10-CM

## 2015-06-19 DIAGNOSIS — Z6832 Body mass index (BMI) 32.0-32.9, adult: Secondary | ICD-10-CM | POA: Diagnosis not present

## 2015-06-19 DIAGNOSIS — O99214 Obesity complicating childbirth: Secondary | ICD-10-CM | POA: Diagnosis present

## 2015-06-19 DIAGNOSIS — E669 Obesity, unspecified: Secondary | ICD-10-CM | POA: Diagnosis present

## 2015-06-19 DIAGNOSIS — O99824 Streptococcus B carrier state complicating childbirth: Secondary | ICD-10-CM | POA: Diagnosis not present

## 2015-06-19 LAB — TYPE AND SCREEN
ABO/RH(D): O POS
ANTIBODY SCREEN: NEGATIVE

## 2015-06-19 LAB — CBC
HEMATOCRIT: 34.7 % — AB (ref 36.0–46.0)
HEMOGLOBIN: 11.8 g/dL — AB (ref 12.0–15.0)
MCH: 30.7 pg (ref 26.0–34.0)
MCHC: 34 g/dL (ref 30.0–36.0)
MCV: 90.4 fL (ref 78.0–100.0)
PLATELETS: 218 10*3/uL (ref 150–400)
RBC: 3.84 MIL/uL — AB (ref 3.87–5.11)
RDW: 13.6 % (ref 11.5–15.5)
WBC: 7.7 10*3/uL (ref 4.0–10.5)

## 2015-06-19 LAB — RPR: RPR: NONREACTIVE

## 2015-06-19 MED ORDER — CITRIC ACID-SODIUM CITRATE 334-500 MG/5ML PO SOLN: 30.0000 mL | ORAL | Status: DC | PRN

## 2015-06-19 MED ORDER — ONDANSETRON HCL 4 MG/2ML IJ SOLN
4.0000 mg | Freq: Four times a day (QID) | INTRAMUSCULAR | Status: DC | PRN
  Administered 2015-06-21: 4 mg via INTRAVENOUS
  Filled 2015-06-19: qty 2

## 2015-06-19 MED ORDER — OXYTOCIN BOLUS FROM INFUSION
500.0000 mL | INTRAVENOUS | Status: DC
  Administered 2015-06-21: 500 mL via INTRAVENOUS

## 2015-06-19 MED ORDER — TERBUTALINE SULFATE 1 MG/ML IJ SOLN
0.2500 mg | Freq: Once | INTRAMUSCULAR | Status: DC | PRN
  Filled 2015-06-19: qty 1

## 2015-06-19 MED ORDER — MISOPROSTOL 25 MCG QUARTER TABLET
25.0000 ug | ORAL_TABLET | ORAL | Status: DC
  Administered 2015-06-19: 25 ug via VAGINAL
  Filled 2015-06-19 (×2): qty 0.25

## 2015-06-19 MED ORDER — ACETAMINOPHEN 325 MG PO TABS
650.0000 mg | ORAL_TABLET | ORAL | Status: DC | PRN
  Administered 2015-06-19: 650 mg via ORAL
  Filled 2015-06-19: qty 2

## 2015-06-19 MED ORDER — PENICILLIN G POTASSIUM 5000000 UNITS IJ SOLR
5.0000 10*6.[IU] | Freq: Once | INTRAVENOUS | Status: AC
  Administered 2015-06-20: 5 10*6.[IU] via INTRAVENOUS
  Filled 2015-06-19 (×2): qty 5

## 2015-06-19 MED ORDER — LACTATED RINGERS IV SOLN
INTRAVENOUS | Status: DC
  Administered 2015-06-19 – 2015-06-21 (×8): via INTRAVENOUS

## 2015-06-19 MED ORDER — LIDOCAINE HCL (PF) 1 % IJ SOLN
30.0000 mL | INTRAMUSCULAR | Status: DC | PRN
  Filled 2015-06-19: qty 30

## 2015-06-19 MED ORDER — OXYTOCIN 40 UNITS IN LACTATED RINGERS INFUSION - SIMPLE MED
62.5000 mL/h | INTRAVENOUS | Status: DC
Start: 2015-06-19 — End: 2015-06-22
  Administered 2015-06-21 – 2015-06-22 (×2): 62.5 mL/h via INTRAVENOUS
  Filled 2015-06-19 (×2): qty 1000

## 2015-06-19 MED ORDER — PENICILLIN G POTASSIUM 5000000 UNITS IJ SOLR
2.5000 10*6.[IU] | INTRAVENOUS | Status: DC
  Administered 2015-06-20 – 2015-06-21 (×9): 2.5 10*6.[IU] via INTRAVENOUS
  Filled 2015-06-19 (×20): qty 2.5

## 2015-06-19 MED ORDER — FENTANYL CITRATE (PF) 100 MCG/2ML IJ SOLN
100.0000 ug | INTRAMUSCULAR | Status: DC | PRN
  Administered 2015-06-19 – 2015-06-21 (×2): 100 ug via INTRAVENOUS
  Filled 2015-06-19 (×3): qty 2

## 2015-06-19 MED ORDER — OXYTOCIN 40 UNITS IN LACTATED RINGERS INFUSION - SIMPLE MED
1.0000 m[IU]/min | INTRAVENOUS | Status: DC
  Administered 2015-06-19: 2 m[IU]/min via INTRAVENOUS
  Administered 2015-06-20: 12 m[IU]/min via INTRAVENOUS
  Administered 2015-06-20: 2 m[IU]/min via INTRAVENOUS
  Filled 2015-06-19 (×2): qty 1000

## 2015-06-19 MED ORDER — LACTATED RINGERS IV SOLN
500.0000 mL | INTRAVENOUS | Status: DC | PRN
  Administered 2015-06-19: 1000 mL via INTRAVENOUS
  Administered 2015-06-19 – 2015-06-20 (×2): 500 mL via INTRAVENOUS

## 2015-06-19 NOTE — H&P (Signed)
LABOR AND DELIVERY ADMISSION HISTORY AND PHYSICAL NOTE  Debra Mccarty is a 24 y.o. female G2P0010 with IUP at [redacted]w[redacted]d by LMP presents for scheduled IOL for post-dates following an uncomplicated pregnancy.   Prenatal History/Complications:  Clinic  [facility] Prenatal Labs  Dating  LMP  Blood type: --/--/O POS (03/29 1403)   Genetic Screen  1 Screen:    AFP:     Quad:     NIPS:  Antibody: neg  Anatomic US  WNL  Rubella: 2.75 (05/26 1948)  GTT  Early:               Third trimester: 74  RPR: Non Reactive (08/27 1537)   Flu vaccine  declined  HBsAg: NEGATIVE (05/26 1948)   TDaP vaccine  Wants in hospital pp          Rhogam: N/A  HIV: NONREACTIVE (08/31 1155)   GBS  POS  GBS: pos  Contraception  Mirena  Pap: wnl 03/2015  Baby Food  Bottle   Circumcision  yes   Pediatrician  Undecided   Support Person  [place] - FOB    Ultrasound:  , CWD, normal anatomy with limited views of CSP, heart and spine, cephalic, longitudinal, 247 g, 50% EFW , CWD, normal anatomy with limited views of ductal arch, marginal placental cord insertion, cephalic, longitudinal, 2107 g, 76% EFW  Past Medical History: Past Medical History  Diagnosis Date  . Anemia    Past Surgical History: Past Surgical History  Procedure Laterality Date  . No past surgeries     Obstetrical History: OB History  Gravida Para Term Preterm AB SAB TAB Ectopic Multiple Living     # Outcome Date GA Lbr Len/2nd Weight Sex Delivery Anes PTL Lv  2 Current           1 TAB              Social History: Social History   Social History  . Marital Status: Married    Spouse Name: N/A  . Number of Children: N/A  . Years of Education: N/A   Social History Main Topics  . Smoking status: Former Games developer  . Smokeless tobacco: Never Used  . Alcohol Use: No  . Drug Use: No  . Sexual Activity: Yes    Birth Control/ Protection: None   Other Topics Concern  . None   Social History Narrative   Family  History: Family History  Problem Relation Age of Onset  . Hypertension Maternal Grandmother   . Arthritis Paternal Grandmother    Allergies: No Known Allergies  Review of Systems  ROS +FM, no VB, no LOF.  Filed Vitals:   06/19/15 0712 06/19/15 0829 06/19/15 0906  BP: 120/70 124/64 130/65  Pulse: 102 95 87  Temp: 98.9 F (37.2 C) 98.4 F (36.9 C) 98.5 F (36.9 C)  TempSrc: Oral Oral Oral  Resp: Height:  (1.727 m)    Weight: 95.255 kg (210 lb)      GEN: alert, comfortable-appearing woman resting in bed PULM: CTAB on frontal field exam CV: RRR, S1 and S2 heard, no M/R/G appreciated ABD: fundus palpable. Abdomen NTTP. No epigastric of RUQ pain. No guarding. Fetus felt to be cephalic by Leopold's. GU: Cervix closed, middle, 40% effaced.  EXTR: No LE edema or calf tenderness.  FHT: HR 130 / moderate variability / +accels / decel Absent Toco: quiet   Prenatal Transfer  Tool  Maternal Diabetes: No Genetic Screening: Declined Maternal Ultrasounds/Referrals: Normal Fetal Ultrasounds or other Referrals:  None Maternal Substance Abuse:  No Significant Maternal Medications:  None Significant Maternal Lab Results: Lab values include: Group B Strep positive  Results for orders placed or performed during the hospital encounter of 06/19/15 (from the past 24 hour(s))  CBC   Collection Time: 06/19/15  7:50 AM  Result Value Ref Range   WBC 7.7 4.0 - 10.5 K/uL   RBC 3.84 (L) 3.87 - 5.11 MIL/uL   Hemoglobin 11.8 (L) 12.0 - 15.0 g/dL   HCT 10.234.7 (L) 72.536.0 - 36.646.0 %   MCV 90.4 78.0 - 100.0 fL   MCH 30.7 26.0 - 34.0 pg   MCHC 34.0 30.0 - 36.0 g/dL   RDW 44.013.6 34.711.5 - 42.515.5 %   Platelets 218 150 - 400 K/uL    Patient Active Problem List   Diagnosis Date Noted  . Post term pregnancy, 41 weeks 06/19/2015  . Encounter for fetal anatomic survey   . Insufficient prenatal care in second trimester 01/01/2015    Assessment: Debra Mccarty is a 24 y.o. G2P0010 with no  significant past medical or prenatal history who presents at at 5643w0d for IOL for post-dates.  -- Labor: Induction with AROM, Pitocin, Cytotec and Foley Balloon as appropriate -- Patient planning on epidural. May have once FB falls out.  -- CBC on admission -- GBS: positive -- Feeding: Bottle -- Contraception: *Mirena -- Circ: yes, outpatient   Diet: Regular until pitocin started, then clears PPx: None for now, low risk & ambulating Code Status: FULL Dispo: Admit to L&D for routine inpatient care of laboring patient.  Jamelle HaringHillary M Fitzgerald, MD  06/19/2015, 9:21 AM  I have seen and examined this patient and agree the above assessment.  Marland Kitchen.  CRESENZO-DISHMAN,Debra Mccarty 06/23/2015 11:23 AM

## 2015-06-19 NOTE — Progress Notes (Addendum)
Debra Mccarty is a 24 y.o. G2P0010 at 4619w0d  admitted for induction of labor due to Post dates. Due date 06/12/15.  Subjective: Feeling ctx more now; foley out this evening and Pit started afterwards  Objective: BP 104/61 mmHg  Pulse 81  Temp(Src) 97.7 F (36.5 C) (Oral)  Resp 17  Ht 5\' 8"  (1.727 m)  Wt 95.255 kg (210 lb)  BMI 31.94 kg/m2  LMP 09/05/2014      FHT:  FHR: 130s bpm, variability: moderate,  accelerations:  Present,  decelerations:  Absent UC:   regular, every 1-4 minutes w/ Pit @ 12 mu/min SVE:   Dilation: 4 Effacement (%): 50 Station: -3 Exam by:: Cam HaiKimberly Kaileigh Viswanathan, CNM- intact  Labs: Lab Results  Component Value Date   WBC 7.7 06/19/2015   HGB 11.8* 06/19/2015   HCT 34.7* 06/19/2015   MCV 90.4 06/19/2015   PLT 218 06/19/2015    Assessment / Plan: IUP@41 .0wks IOL process Possible latent phase GBS pos  Continue to increase Pit PCN for GBS prophylaxis  Ehtan Delfavero CNM 06/19/2015, 11:06 PM

## 2015-06-20 MED ORDER — TERBUTALINE SULFATE 1 MG/ML IJ SOLN
0.2500 mg | Freq: Once | INTRAMUSCULAR | Status: DC | PRN
  Filled 2015-06-20: qty 1

## 2015-06-20 MED ORDER — MISOPROSTOL 25 MCG QUARTER TABLET
25.0000 ug | ORAL_TABLET | ORAL | Status: DC | PRN
  Administered 2015-06-20 (×2): 25 ug via VAGINAL
  Filled 2015-06-20: qty 1
  Filled 2015-06-20 (×2): qty 0.25

## 2015-06-20 NOTE — Progress Notes (Signed)
Patient ID: Debra Mccarty, female   DOB: Sep 23, 1990, 23 y.o.   MRN: [id number] Debra Mccarty is a 24 y.o. G2P0010 at 630w1d admitted for induction of labor due to Post dates. Due date 06/12/15.  Subjective: Doing well, not feeling any uc's, just finished eating  Objective: BP 123/70 mmHg  Pulse 94  Temp(Src) 98.3 F (36.8 C) (Oral)  Resp 18  Ht 5\' 8"  (1.727 m)  Wt 95.255 kg (210 lb)  BMI 31.94 kg/m2  LMP 09/05/2014    FHT:  FHR: 125 bpm, variability: moderate,  accelerations:  Present,  decelerations:  Absent UC:   irregular  SVE:   Dilation: 4 Effacement (%): 50 Station: -2 Exam by:: Genella RifeK. Kron Everton, CNM  Labs: Lab Results  Component Value Date   WBC 7.7 06/19/2015   HGB 11.8* 06/19/2015   HCT 34.7* 06/19/2015   MCV 90.4 06/19/2015   PLT 218 06/19/2015    Assessment / Plan: IOL d/t postdates, s/p foley bulb, cytotec, pitocin that was turned off this am, then cytotec during day today. Restart pitocin per protocol.  Labor: s/p cervical ripening Fetal Wellbeing:  Category I Pain Control:  n/a Pre-eclampsia: n/a I/D:  pcn for gbs+ Anticipated MOD:  NSVD  [name], [name] CNM, WHNP-BC 06/20/2015, 9:31 PM

## 2015-06-20 NOTE — Progress Notes (Addendum)
Patient ID: Garrison Columbusarolann Schedler, female   DOB: 26-May-1991, 24 y.o.   MRN: [id number] Garrison ColumbusCarolann Goldie is a 24 y.o. G2P0010 at 8557w1d admitted for IOL  Subjective: Comfortable. Not feeling UCs. Cytotec placed 4 hr ago  Objective: BP 122/41 mmHg  Pulse 93  Temp(Src) 98.5 F (36.9 C) (Oral)  Resp 18  Ht 5\' 8"  (1.727 m)  Wt 95.255 kg (210 lb)  BMI 31.94 kg/m2  LMP 09/05/2014  Fetal Heart FHR: 145 bpm, variability: moderate,  accelerations:  Present,  decelerations:  Absent   Contractions: irregular, mild.  SVE:   Dilation: 4 Effacement (%): 50, 60 Station: -2 Exam by::  (Lamyiah Crawshaw, CNM) Cephalic -2/-3. Cx posterior. Swept membranes> slight show.   Assessment / Plan:  Labor: Not established. Discussed option of further ripening>  another cytotec placed Fetal Wellbeing: Category 1 Pain Control:  n/a Expected mode of delivery: NSVD  Sonya Pucci 06/20/2015, 2:34 PM

## 2015-06-20 NOTE — Progress Notes (Signed)
Patient ID: Debra Mccarty, female   DOB: August 15, 1990, 24 y.o.   MRN: [id number] Debra Mccarty is a 24 y.o. G2P0010 at 3021w1d admitted for IOL indicated by postdates  Subjective: Comfortable; no UCs perceived.  Pitocin turned off at 0630.  Objective: BP 119/67 mmHg  Pulse 101  Temp(Src) 98.5 F (36.9 C) (Oral)  Resp 18  Ht 5\' 8"  (1.727 m)  Wt 95.255 kg (210 lb)  BMI 31.94 kg/m2  LMP 09/05/2014  Fetal Heart FHR: 145 bpm, variability: moderate,  accelerations:  Present,  decelerations:  Absent  Contractions: irregular, mild  SVE:   Dilation: 4 Effacement (%): 50 Station: -3 Exam by:: Debra Mccarty, CNM Exam is post Foley bulb  Assessment / Plan: Discussed with patient rationale and plan to further ripen cx> will proceed with cytotec Labor: not in established labor Fetal Wellbeing: Category 1 Pain Control:  n/a Expected mode of delivery: NSVD  Debra Mccarty 06/20/2015, 9:41 AM

## 2015-06-21 ENCOUNTER — Inpatient Hospital Stay (HOSPITAL_COMMUNITY): Payer: Medicaid Other | Admitting: Anesthesiology

## 2015-06-21 DIAGNOSIS — O99824 Streptococcus B carrier state complicating childbirth: Secondary | ICD-10-CM

## 2015-06-21 DIAGNOSIS — Z3A41 41 weeks gestation of pregnancy: Secondary | ICD-10-CM

## 2015-06-21 MED ORDER — DIPHENHYDRAMINE HCL 50 MG/ML IJ SOLN: 12.5000 mg | INTRAMUSCULAR | Status: DC | PRN

## 2015-06-21 MED ORDER — PHENYLEPHRINE 40 MCG/ML (10ML) SYRINGE FOR IV PUSH (FOR BLOOD PRESSURE SUPPORT)
80.0000 ug | PREFILLED_SYRINGE | INTRAVENOUS | Status: DC | PRN
Start: 2015-06-21 — End: 2015-06-22
  Filled 2015-06-21: qty 2
  Filled 2015-06-21: qty 20

## 2015-06-21 MED ORDER — FENTANYL 2.5 MCG/ML BUPIVACAINE 1/10 % EPIDURAL INFUSION (WH - ANES)
14.0000 mL/h | INTRAMUSCULAR | Status: DC | PRN
  Administered 2015-06-21 (×3): 14 mL/h via EPIDURAL
  Filled 2015-06-21 (×3): qty 125

## 2015-06-21 MED ORDER — EPHEDRINE 5 MG/ML INJ
10.0000 mg | INTRAVENOUS | Status: DC | PRN
  Filled 2015-06-21: qty 2

## 2015-06-21 MED ORDER — LIDOCAINE HCL (PF) 1 % IJ SOLN
INTRAMUSCULAR | Status: DC | PRN
  Administered 2015-06-21 (×2): 4 mL

## 2015-06-21 NOTE — Progress Notes (Signed)
Garrison ColumbusCarolann Nest is a 24 y.o. G2P0010 at 7230w2d by ultrasound admitted for induction of labor due to Post dates. Due date 06/14/15.  Subjective:   Objective: BP 126/61 mmHg  Pulse 98  Temp(Src) 98.3 F (36.8 C) (Oral)  Resp 18  Ht 5\' 8"  (1.727 m)  Wt 210 lb (95.255 kg)  BMI 31.94 kg/m2  LMP 09/05/2014      FHT:  FHR: 120's bpm, variability: moderate,  accelerations:  Present,  decelerations:  Absent UC:   regular, every 4-5 minutes SVE:   Dilation: 5 Effacement (%): 80 Station: -1 Exam by:: ConAgra FoodsSmith  Labs: Lab Results  Component Value Date   WBC 7.7 06/19/2015   HGB 11.8* 06/19/2015   HCT 34.7* 06/19/2015   MCV 90.4 06/19/2015   PLT 218 06/19/2015    Assessment / Plan: Induction of labor due to postterm,  progressing well on pitocin  Labor: Progressing normally Preeclampsia:  no signs or symptoms of toxicity and intake and ouput balanced Fetal Wellbeing:  Category I Pain Control:  Epidural I/D:  n/a Anticipated MOD:  NSVD  Linville Decarolis DARLENE 06/21/2015, 12:09 PM

## 2015-06-21 NOTE — Progress Notes (Signed)
Debra Mccarty is a 24 y.o. G2P0010 at 7579w2d by ultrasound admitted for induction of labor due to Post dates. Due date 06/13/15.  Subjective:   Objective: BP 122/80 mmHg  Pulse 82  Temp(Src) 98.1 F (36.7 C) (Oral)  Resp 18  Ht 5\' 8"  (1.727 m)  Wt 210 lb (95.255 kg)  BMI 31.94 kg/m2  LMP 09/05/2014   Total I/O In: -  Out: 1350 [Urine:1350]  FHT:  FHR: 150 bpm, variability: moderate,  accelerations:  Present,  decelerations:  Absent UC:   regular, every 2 minutes SVE:   Dilation: 10 Effacement (%): 90 Station: +2 Exam by:: Debra CorporationSmith RN  Labs: Lab Results  Component Value Date   WBC 7.7 06/19/2015   HGB 11.8* 06/19/2015   HCT 34.7* 06/19/2015   MCV 90.4 06/19/2015   PLT 218 06/19/2015    Assessment / Plan: Induction of labor due to gestational diabetes,  progressing well on pitocin  Labor: Progressing normally Preeclampsia:  no signs or symptoms of toxicity and intake and ouput balanced Fetal Wellbeing:  Category I Pain Control:  Epidural I/D:  n/a Anticipated MOD:  NSVD  Mccarty, Debra DARLENE 06/21/2015, 6:25 PM

## 2015-06-21 NOTE — Progress Notes (Signed)
Patient ID: Debra Mccarty, female   DOB: Oct 27, 1990, 24 y.o.   MRN: [id number] Debra ColumbusCarolann Mccarty is a 24 y.o. G2P0010 at 5269w2d admitted for induction of labor due to Post dates. Due date 11/4.  Subjective: Not feeling any uc's, has been able to sleep  Objective: BP 115/59 mmHg  Pulse 83  Temp(Src) 98.2 F (36.8 C) (Oral)  Resp 18  Ht 5\' 8"  (1.727 m)  Wt 95.255 kg (210 lb)  BMI 31.94 kg/m2  LMP 09/05/2014    FHT:  FHR: 135 bpm, variability: moderate,  accelerations:  Present,  decelerations:  Absent UC:   q 2-36mins  SVE:   Dilation: 4 Effacement (%): 50 Station: -2 Exam by:: Genella RifeK. Booker, CNM  AROM small amt clear fluid  Pitocin @ 20 mu/min  Labs: Lab Results  Component Value Date   WBC 7.7 06/19/2015   HGB 11.8* 06/19/2015   HCT 34.7* 06/19/2015   MCV 90.4 06/19/2015   PLT 218 06/19/2015    Assessment / Plan: IOL d/t postdates, pitocin @ 320mu/min, not feeling any uc's, no cervical change, now arom'd  Labor: early Fetal Wellbeing:  Category I Pain Control:  n/a Pre-eclampsia: n/a I/D:  pcn for gbs+ Anticipated MOD:  NSVD  [name], Kimberly Randall CNM, WHNP-BC 06/21/2015, 6:43 AM

## 2015-06-21 NOTE — Anesthesia Procedure Notes (Signed)
Epidural Patient location during procedure: OB  Staffing Anesthesiologist: Waynesha Rammel Performed by: anesthesiologist   Preanesthetic Checklist Completed: patient identified, site marked, surgical consent, pre-op evaluation, timeout performed, IV checked, risks and benefits discussed and monitors and equipment checked  Epidural Patient position: sitting Prep: site prepped and draped and DuraPrep Patient monitoring: continuous pulse ox and blood pressure Approach: midline Location: L3-L4 Injection technique: LOR saline  Needle:  Needle type: Tuohy  Needle gauge: 17 G Needle length: 9 cm and 9 Needle insertion depth: 7 cm Catheter type: closed end flexible Catheter size: 19 Gauge Catheter at skin depth: 12 cm Test dose: negative  Assessment Events: blood not aspirated, injection not painful, no injection resistance, negative IV test and no paresthesia  Additional Notes Patient identified. Risks/Benefits/Options discussed with patient including but not limited to bleeding, infection, nerve damage, paralysis, failed block, incomplete pain control, headache, blood pressure changes, nausea, vomiting, reactions to medication both or allergic, itching and postpartum back pain. Confirmed with bedside nurse the patient's most recent platelet count. Confirmed with patient that they are not currently taking any anticoagulation, have any bleeding history or any family history of bleeding disorders. Patient expressed understanding and wished to proceed. All questions were answered. Sterile technique was used throughout the entire procedure. Please see nursing notes for vital signs. Test dose was given through epidural catheter and negative prior to continuing to dose epidural or start infusion. Warning signs of high block given to the patient including shortness of breath, tingling/numbness in hands, complete motor block, or any concerning symptoms with instructions to call for help. Patient was  given instructions on fall risk and not to get out of bed. All questions and concerns addressed with instructions to call with any issues or inadequate analgesia.      

## 2015-06-21 NOTE — Anesthesia Preprocedure Evaluation (Signed)
Anesthesia Evaluation  Patient identified by MRN, date of birth, ID band Patient awake    Reviewed: Allergy & Precautions, NPO status , Patient's Chart, lab work & pertinent test results  History of Anesthesia Complications Negative for: history of anesthetic complications  Airway Mallampati: II  TM Distance: >3 FB Neck ROM: Full    Dental no notable dental hx. (+) Dental Advisory Given   Pulmonary former smoker,    Pulmonary exam normal breath sounds clear to auscultation       Cardiovascular negative cardio ROS Normal cardiovascular exam Rhythm:Regular Rate:Normal     Neuro/Psych negative neurological ROS  negative psych ROS   GI/Hepatic negative GI ROS, Neg liver ROS,   Endo/Other  obesity  Renal/GU negative Renal ROS  negative genitourinary   Musculoskeletal negative musculoskeletal ROS (+)   Abdominal   Peds negative pediatric ROS (+)  Hematology negative hematology ROS (+)   Anesthesia Other Findings   Reproductive/Obstetrics (+) Pregnancy                             Anesthesia Physical Anesthesia Plan  ASA: II  Anesthesia Plan: Epidural   Post-op Pain Management:    Induction:   Airway Management Planned:   Additional Equipment:   Intra-op Plan:   Post-operative Plan:   Informed Consent: I have reviewed the patients History and Physical, chart, labs and discussed the procedure including the risks, benefits and alternatives for the proposed anesthesia with the patient or authorized representative who has indicated his/her understanding and acceptance.   Dental advisory given  Plan Discussed with:   Anesthesia Plan Comments:         Anesthesia Quick Evaluation

## 2015-06-21 NOTE — Progress Notes (Signed)
Patient ID: Debra Mccarty, female   DOB: Jul 14, 1991, 24 y.o.   MRN: [id number] Debra Mccarty is a 24 y.o. G2P0010 at 6562w2d admitted for induction of labor due to Post dates. Due date 11/4.   Subjective: Pt sleeping  Objective: BP 102/37 mmHg  Pulse 75  Temp(Src) 98.3 F (36.8 C) (Oral)  Resp 18  Ht 5\' 8"  (1.727 m)  Wt 95.255 kg (210 lb)  BMI 31.94 kg/m2  LMP 09/05/2014    FHT:  FHR: 130 bpm, variability: moderate,  accelerations:  Present,  decelerations:  Present occ variable UC:   q 1-775min  SVE:   Dilation: 4 Effacement (%): 50 Station: -2 Exam by:: Genella RifeK. Kaylub Detienne, CNM  Pitocin @ 10 mu/min, hasn't been increased since 0155  Labs: Lab Results  Component Value Date   WBC 7.7 06/19/2015   HGB 11.8* 06/19/2015   HCT 34.7* 06/19/2015   MCV 90.4 06/19/2015   PLT 218 06/19/2015    Assessment / Plan: IOL d/t postdates, continue increasing pitocin per protocol to achieve adequate labor pattern  Labor: early Fetal Wellbeing:  Category II Pain Control:  n/a Pre-eclampsia: n/a I/D:  pcn for gbs+ Anticipated MOD:  NSVD  [name], Kian Ottaviano Randall CNM, WHNP-BC 06/21/2015, 3:33 AM

## 2015-06-22 ENCOUNTER — Encounter (HOSPITAL_COMMUNITY): Payer: Self-pay

## 2015-06-22 LAB — CBC
HCT: 29.9 % — ABNORMAL LOW (ref 36.0–46.0)
HEMATOCRIT: 30.2 % — AB (ref 36.0–46.0)
HEMOGLOBIN: 10 g/dL — AB (ref 12.0–15.0)
Hemoglobin: 10.3 g/dL — ABNORMAL LOW (ref 12.0–15.0)
MCH: 30.6 pg (ref 26.0–34.0)
MCH: 29.9 pg (ref 26.0–34.0)
MCHC: 34.1 g/dL (ref 30.0–36.0)
MCHC: 33.4 g/dL (ref 30.0–36.0)
MCV: 89.6 fL (ref 78.0–100.0)
MCV: 89.5 fL (ref 78.0–100.0)
PLATELETS: 185 10*3/uL (ref 150–400)
PLATELETS: 178 10*3/uL (ref 150–400)
RBC: 3.37 MIL/uL — ABNORMAL LOW (ref 3.87–5.11)
RBC: 3.34 MIL/uL — ABNORMAL LOW (ref 3.87–5.11)
RDW: 13.5 % (ref 11.5–15.5)
RDW: 13.4 % (ref 11.5–15.5)
WBC: 14.2 10*3/uL — AB (ref 4.0–10.5)
WBC: 12.6 10*3/uL — ABNORMAL HIGH (ref 4.0–10.5)

## 2015-06-22 LAB — TYPE AND SCREEN
ABO/RH(D): O POS
Antibody Screen: NEGATIVE

## 2015-06-22 MED ORDER — DIPHENHYDRAMINE HCL 25 MG PO CAPS: 25.0000 mg | ORAL_CAPSULE | Freq: Four times a day (QID) | ORAL | Status: DC | PRN

## 2015-06-22 MED ORDER — ACETAMINOPHEN 325 MG PO TABS: 650.0000 mg | ORAL_TABLET | ORAL | Status: DC | PRN

## 2015-06-22 MED ORDER — METHYLERGONOVINE MALEATE 0.2 MG/ML IJ SOLN
INTRAMUSCULAR | Status: AC
  Filled 2015-06-22: qty 1

## 2015-06-22 MED ORDER — OXYCODONE-ACETAMINOPHEN 5-325 MG PO TABS: 2.0000 | ORAL_TABLET | ORAL | Status: DC | PRN

## 2015-06-22 MED ORDER — DIPHENOXYLATE-ATROPINE 2.5-0.025 MG PO TABS
2.0000 | ORAL_TABLET | Freq: Once | ORAL | Status: AC
  Administered 2015-06-22: 2 via ORAL
  Filled 2015-06-22: qty 2

## 2015-06-22 MED ORDER — TETANUS-DIPHTH-ACELL PERTUSSIS 5-2.5-18.5 LF-MCG/0.5 IM SUSP
0.5000 mL | Freq: Once | INTRAMUSCULAR | Status: AC
  Administered 2015-06-23: 0.5 mL via INTRAMUSCULAR
  Filled 2015-06-22: qty 0.5

## 2015-06-22 MED ORDER — OXYTOCIN 40 UNITS IN LACTATED RINGERS INFUSION - SIMPLE MED
250.0000 mL/h | INTRAVENOUS | Status: DC
Start: 2015-06-22 — End: 2015-06-23
  Administered 2015-06-22: 999 mL/h via INTRAVENOUS

## 2015-06-22 MED ORDER — METHYLERGONOVINE MALEATE 0.2 MG PO TABS
0.2000 mg | ORAL_TABLET | ORAL | Status: AC
  Administered 2015-06-22 – 2015-06-23 (×6): 0.2 mg via ORAL
  Filled 2015-06-22 (×6): qty 1

## 2015-06-22 MED ORDER — SIMETHICONE 80 MG PO CHEW: 80.0000 mg | CHEWABLE_TABLET | ORAL | Status: DC | PRN

## 2015-06-22 MED ORDER — METHYLERGONOVINE MALEATE 0.2 MG/ML IJ SOLN: 0.2000 mg | Freq: Once | INTRAMUSCULAR | Status: DC

## 2015-06-22 MED ORDER — MISOPROSTOL 200 MCG PO TABS
800.0000 ug | ORAL_TABLET | Freq: Once | ORAL | Status: DC
  Filled 2015-06-22: qty 4

## 2015-06-22 MED ORDER — DIBUCAINE 1 % RE OINT
1.0000 "application " | TOPICAL_OINTMENT | RECTAL | Status: DC | PRN
  Administered 2015-06-22: 1 via RECTAL
  Filled 2015-06-22: qty 28

## 2015-06-22 MED ORDER — PRENATAL MULTIVITAMIN CH
1.0000 | ORAL_TABLET | Freq: Every day | ORAL | Status: DC
  Administered 2015-06-22 – 2015-06-23 (×2): 1 via ORAL
  Filled 2015-06-22 (×2): qty 1

## 2015-06-22 MED ORDER — ZOLPIDEM TARTRATE 5 MG PO TABS: 5.0000 mg | ORAL_TABLET | Freq: Every evening | ORAL | Status: DC | PRN

## 2015-06-22 MED ORDER — BENZOCAINE-MENTHOL 20-0.5 % EX AERO
1.0000 "application " | INHALATION_SPRAY | CUTANEOUS | Status: DC | PRN
  Administered 2015-06-22: 1 via TOPICAL
  Filled 2015-06-22: qty 56

## 2015-06-22 MED ORDER — MISOPROSTOL 200 MCG PO TABS
ORAL_TABLET | ORAL | Status: AC
  Administered 2015-06-22: 800 ug via RECTAL
  Filled 2015-06-22: qty 4

## 2015-06-22 MED ORDER — ONDANSETRON HCL 4 MG/2ML IJ SOLN: 4.0000 mg | INTRAMUSCULAR | Status: DC | PRN

## 2015-06-22 MED ORDER — ONDANSETRON HCL 4 MG PO TABS: 4.0000 mg | ORAL_TABLET | ORAL | Status: DC | PRN

## 2015-06-22 MED ORDER — SENNOSIDES-DOCUSATE SODIUM 8.6-50 MG PO TABS
2.0000 | ORAL_TABLET | ORAL | Status: DC
  Administered 2015-06-22: 2 via ORAL
  Filled 2015-06-22: qty 2

## 2015-06-22 MED ORDER — MISOPROSTOL 200 MCG PO TABS
800.0000 ug | ORAL_TABLET | Freq: Once | ORAL | Status: DC
  Administered 2015-06-22: 800 ug via RECTAL

## 2015-06-22 MED ORDER — FENTANYL CITRATE (PF) 100 MCG/2ML IJ SOLN
100.0000 ug | Freq: Once | INTRAMUSCULAR | Status: AC
  Administered 2015-06-22: 100 ug via INTRAVENOUS

## 2015-06-22 MED ORDER — METHYLERGONOVINE MALEATE 0.2 MG/ML IJ SOLN
0.2000 mg | Freq: Once | INTRAMUSCULAR | Status: AC
  Administered 2015-06-22: 0.2 mg via INTRAMUSCULAR

## 2015-06-22 MED ORDER — WITCH HAZEL-GLYCERIN EX PADS
1.0000 "application " | MEDICATED_PAD | CUTANEOUS | Status: DC | PRN
  Administered 2015-06-22: 1 via TOPICAL

## 2015-06-22 MED ORDER — IBUPROFEN 600 MG PO TABS
600.0000 mg | ORAL_TABLET | Freq: Four times a day (QID) | ORAL | Status: DC
  Administered 2015-06-22 – 2015-06-23 (×6): 600 mg via ORAL
  Filled 2015-06-22 (×6): qty 1

## 2015-06-22 MED ORDER — LANOLIN HYDROUS EX OINT: TOPICAL_OINTMENT | CUTANEOUS | Status: DC | PRN

## 2015-06-22 MED ORDER — OXYCODONE-ACETAMINOPHEN 5-325 MG PO TABS
1.0000 | ORAL_TABLET | ORAL | Status: DC | PRN
  Administered 2015-06-22: 1 via ORAL
  Filled 2015-06-22: qty 1

## 2015-06-22 MED ORDER — CARBOPROST TROMETHAMINE 250 MCG/ML IM SOLN
250.0000 ug | INTRAMUSCULAR | Status: DC | PRN
  Administered 2015-06-22: 250 ug via INTRAMUSCULAR

## 2015-06-22 NOTE — Anesthesia Postprocedure Evaluation (Signed)
  Anesthesia Post-op Note  Patient: Debra Mccarty  Procedure(s) Performed: * No procedures listed *  Patient Location: PACU and Mother/Baby  Anesthesia Type:Epidural  Level of Consciousness: awake, alert  and oriented  Airway and Oxygen Therapy: Patient Spontanous Breathing  Post-op Pain: mild  Post-op Assessment: Patient's Cardiovascular Status Stable, Respiratory Function Stable, No signs of Nausea or vomiting, Adequate PO intake, Pain level controlled, No headache, No backache and Patient able to bend at knees              Post-op Vital Signs: Reviewed and stable  Last Vitals:  Filed Vitals:   06/22/15 0555  BP: 100/46  Pulse: 59  Temp: 37.2 C  Resp: 18    Complications: No apparent anesthesia complications

## 2015-06-22 NOTE — Progress Notes (Signed)
Post Partum Day 1 Subjective: no complaints and tolerating PO  Objective: Blood pressure 116/60, pulse 89, temperature 98.6 F (37 C), temperature source Oral, resp. rate 19, height 5\' 8"  (1.727 m), weight 210 lb (95.255 kg), last menstrual period 09/05/2014, SpO2 98 %, unknown if currently breastfeeding.  Physical Exam:  General: alert, cooperative, appears stated age and no distress Lochia: appropriate Uterine Fundus: firm Incision: healing well DVT Evaluation: Negative Homan's sign. No cords or calf tenderness.   Recent Labs  06/19/15 0750 06/22/15 0220  HGB 11.8* 10.3*  HCT 34.7* 30.2*    Assessment/Plan: Plan for discharge tomorrow S/P PPH secondary to uterine atony   LOS: 3 days   LAWSON, [name] 06/22/2015, 4:08 AM

## 2015-06-22 NOTE — Lactation Note (Addendum)
This note was copied from the chart of Debra Jahniya Petroni. Lactation Consultation Note New mom, has tender breast and nipples. Mom can't tolerate hand expression or the baby latching. Mom pulls baby off saying it is hurting to bad. Mom has everted nipples for deep latch. Mom very sleepy. Unable to give instructions d/t sleepiness. Took DEBP into room d/t mom states she wants to pump but is to sleepy to do it now.  Discussed positioning, and proper latching. Mom will need information again d/t sleepiness. Patient Name: Debra Mccarty Today's Date: 06/22/2015 Reason for consult: Initial assessment   Maternal Data Has patient been taught Hand Expression?: Yes Does the patient have breastfeeding experience prior to this delivery?: No  Feeding Feeding Type: Breast Fed Length of feed: 0 min  LATCH Score/Interventions Latch: Repeated attempts needed to sustain latch, nipple held in mouth throughout feeding, stimulation needed to elicit sucking reflex. Intervention(s): Skin to skin;Teach feeding cues;Waking techniques Intervention(s): Adjust position;Assist with latch;Breast massage;Breast compression  Audible Swallowing: None  Type of Nipple: Everted at rest and after stimulation  Comfort (Breast/Nipple): Filling, red/small blisters or bruises, mild/mod discomfort (mom tender to touch or suckle)  Problem noted: Mild/Moderate discomfort Interventions (Mild/moderate discomfort): Hand massage;Hand expression Interventions (Severe discomfort): Double electric pum  Hold (Positioning): Assistance needed to correctly position infant at breast and maintain latch. Intervention(s): Breastfeeding basics reviewed;Support Pillows;Position options;Skin to skin  LATCH Score: 5  Lactation Tools Discussed/Used Pump Review: Setup, frequency, and cleaning;Milk Storage Initiated by:: Peri JeffersonL. Elyce Zollinger RN Date initiated:: 06/22/15   Consult Status Consult Status: Follow-up Date: 06/22/15 Follow-up  type: In-patient    Debra Mccarty, Debra Mccarty 06/22/2015, 5:19 AM

## 2015-06-22 NOTE — Clinical Social Work Maternal (Signed)
CLINICAL SOCIAL WORK MATERNAL/CHILD NOTE  Patient Details  Name: Debra Mccarty MRN: [id number] Date of Birth: 11/30/1990  Date:  06/22/2015  Clinical Social Worker Initiating Note:  Loleta BooksSarah Jarred Purtee MSW, LCSW Date/ Time Initiated:  06/22/15/1130     Child's Name:  Debra Mccarty   Legal Guardian:  Debra Mccarty and Debra Mccarty   Need for Interpreter:  None   Date of Referral:  2015/03/23     Reason for Referral:  Late or No Prenatal Care    [facility]  [facility]   Address:  [address] [place], [place] [address]  Phone number:  [phone]   Household Members:  Spouse   Natural Supports (not living in the home):  Extended Family, Immediate Family   Professional Supports: None   Employment: Consulting civil engineertudent   Type of Work:     Education:  Holiday representativeAttending college   Financial Resources:  Medicaid   [facility]   Cultural/Religious Considerations Which May Impact Care:  None reported  Strengths:  Home prepared for child , Ability to meet basic needs    Risk Factors/Current Problems:   1) Late prenatal care: MOB experienced difficulties obtaining Medicaid, which led to late initiation of care at 30 week.   MOB denied substance use during the pregnancy. Infant's UDS is negative, and MDS is pending.   Cognitive State:  Able to Concentrate , Alert , Insightful , Goal Oriented , Linear Thinking    Mood/Affect:  Comfortable , Calm , Happy    CSW Assessment:  CSW received request for consult due to MOB presenting with a history of late prenatal care at 4930 weeks gestational age.  MOB was providing skin to skin during the assessment, presented in a pleasant mood, and displayed a full range in affect.  MOB provided consent for her mother and her grandmother to remain in the room during the entire assessment.    MOB reported feeling "tired" and "exhausted" secondary to long childbirth experience. MOB stated that it was less painful than she anticipated, but  reported that her postpartum hemophage was more complicated and difficult than childbirth. She shared that she did not feel well over well, but hopes to be able to feel better and begin to enjoy the bonding and caring for the infant today.  MOB reported difficulties with breastfeeding, but did not continue to discuss her feelings surrounding breastfeeding in detail.  MOB shared that the home is prepared for the infant, and showed CSW the decorations in the nursery. MOB reported that she intends to remain at home with the infant, but will return to school in January. She shared that she will take college courses at [employer], and reported that she will then transfer her credits to [employer], where she has one year remaining. MOB endorsed feeling well supported as she transitions home, and stated that her grandmother will be spending time in her home at discharge in order to provide her with additional support. MOB denied prior mental health history, and denied history of perinatal mood disorders. MOB presented as attentive and engaged as CSW provided education on perinatal mood disorders. She asked questions related to how to respond and who to call if she notes onset of symptoms, and she agreed to follow up with her medical provider if needs arise.  MOB reported late prenatal care was due to "insurance issues". She shared that she attempted to initiate insurance with her husband's employer, but reported that she was unable to enroll. MOB reported that  she then applied for Medicaid and was able to start care. MOB denied any additional barriers to accessing care, and denied any barriers to care postpartum.  CSW informed MOB of hospital drug screen policy, and MOB verbalized understanding. She denied questions or concerns, and denied any substance use during the pregnancy.   MOB informed of ongoing CSW availability during the admission. MOB expressed appreciation for the visit, and agreed to contact CSW if needs arise.    CSW Plan/Description:   1)Patient/Family Education: Perinatal mood disorders, hospital drug screen policy 2) CSW to monitor infant's toxicology screens, and will make a CPS report if positive.  3)No Further Intervention Required/No Barriers to Discharge    Emery Dupuy N, LCSW 06/22/2015, 12:01 PM  

## 2015-06-22 NOTE — Progress Notes (Signed)
PP Progress Note  Debra Mccarty is a 24 y.o. G2P1011 at 7878w2d  admitted on 06/19/15 for induction of labor due to Post dates. She delivered on 06/21/15 at 2334 with EBL of 200 mL after delivery. Labor was significant for a prolonged second stage. Around 0200, she had increased blood loss 2/2 uterine atony. She received 0.2 mL methergine, followed by 800 mcg rectal cytotec. Blood loss at that time was estimated at over 800 mL. Carboprost 250 mcg was given IM at 0216. Atony resolved. Lomotil was also administered.

## 2015-06-22 NOTE — Lactation Note (Addendum)
This note was copied from the chart of Debra Mccarty. Lactation Consultation Note  Patient Name: Debra Garrison ColumbusCarolann Fults [id number]'s Date: 06/22/2015 Reason for consult: Follow-up assessment   With this first time mom of a term baby, now 2713 hours old. Mom has not been latching the baby due to painful latch, and the baby has been formula fed. Mom has very sensitive breasts, and can not tolerate even gentle hand expression.  On exam of baby's mouth, he has an upper lip frenulum that extends to the gum line, and a lingual posterior frenulum that appears tight, mid tongue, and with finger sucking, his tongue would fall behind his gums. Mom describes severe pain, biting with latch, worse with 24 nipple shield. Mom wuold not give the baby time to obtain a deep- latch. Mom pumped once since given the DEP this morning. i explained how pumping is necessary , even if she does not express any milk, to protect her milk supply. I had mom pump in premie setting, nd she had expressed enough colostrum to moisten her nipples and areola.  MGM and MGGM both asking that the baby get soy formula, saying that dad  Does not tolerate cows milk, and MGM feels mom "get very mucousy" with cows milk. Mom did not have much ot say about this, but seems to go along with what her mom and grandma say. I paged Dr. Carma Lairetinaur, and will ask her if she want to switch the baby's formula to soy.  I spoke to Dr. Carma Lairetinaur, and she wants the baby to have Sim 19 cal formula, which is what I brought to the mom. I told her to offer up to 10 ml's today, and once 24 hours old, can increase to 20 ml's, on cue. Mom doing skin to skin now, and baby content    Maternal Data    Feeding Feeding Type: Breast Fed Length of feed: 2 min  LATCH Score/Interventions Latch: Repeated attempts needed to sustain latch, nipple held in mouth throughout feeding, stimulation needed to elicit sucking reflex. (mom pushed baby away due to severe biting  pain) Intervention(s): Skin to skin;Teach feeding cues Intervention(s): Adjust position;Assist with latch;Breast compression  Audible Swallowing:  (colosstrum seen after pumping in premie setting) Intervention(s): Skin to skin  Type of Nipple: Everted at rest and after stimulation  Comfort (Breast/Nipple): Soft / non-tender  Problem noted: Severe discomfort Interventions (Severe discomfort): Flange size;Double electric pum (decreased to 24 flanges with comfortable fit)  Hold (Positioning): Assistance needed to correctly position infant at breast and maintain latch. Intervention(s): Breastfeeding basics reviewed;Support Pillows;Position options;Skin to skin  LATCH Score: 6  Lactation Tools Discussed/Used Tools: Nipple Shields Nipple shield size: 24 WIC Program: No (fax t be [facility]) Pump Review: Setup, frequency, and cleaning;Milk Storage;Other (comment) (premie setting, hand expression) Initiated by:: Yvone NeuLaura Caarver, RN, IBCLC Date initiated:: 06/22/15   Consult Status Consult Status: Follow-up Date: 06/23/15 Follow-up type: In-patient    Alfred LevinsLee, Sharonlee Nine Anne 06/22/2015, 12:43 PM

## 2015-06-23 MED ORDER — FERROUS SULFATE 325 (65 FE) MG PO TABS: 325.0000 mg | ORAL_TABLET | Freq: Two times a day (BID) | ORAL | Status: AC

## 2015-06-23 MED ORDER — IBUPROFEN 600 MG PO TABS: 600.0000 mg | ORAL_TABLET | Freq: Four times a day (QID) | ORAL | Status: AC

## 2015-06-23 NOTE — Lactation Note (Signed)
This note was copied from the chart of Debra Neli Avilla. Lactation Consultation Note  Patient Name: Debra Mccarty ZOXWR'UToday's Date: 06/23/2015 Reason for consult: Follow-up assessment;Breast/nipple pain   Follow up with mom prior to D/C. Assisted mom to latch infant to right breast in football hold. Mom was anxious that latch would be painful. Infant latched easily with flanged lips and rhythmic sucking. Infant with a few swallows noted. INfant did need stimulation to maintain suckling, discussed stimulation techniques with mom. Enc mom to BF first followed by supplementation of EBM or Formula. Enc mom to pump q 2-3 hours if infant not BF for 15 minutes to initiate and maintain a supply. Encouraged mom to assist baby to obtain a deep latch. Mom did report pain with initial latch that did improve, she said that it felt fine today. [facility] am. She knows to call with questions/concerns and to confirm appointment.    Maternal Data Formula Feeding for Exclusion: No Has patient been taught Hand Expression?: Yes Does the patient have breastfeeding experience prior to this delivery?: No  Feeding Feeding Type: Breast Fed Nipple Type: Slow - flow  LATCH Score/Interventions Latch: Repeated attempts needed to sustain latch, nipple held in mouth throughout feeding, stimulation needed to elicit sucking reflex. Intervention(s): Skin to skin;Teach feeding cues;Waking techniques Intervention(s): Assist with latch;Adjust position;Breast compression;Breast massage  Audible Swallowing: A few with stimulation  Type of Nipple: Everted at rest and after stimulation  Comfort (Breast/Nipple): Filling, red/small blisters or bruises, mild/mod discomfort  Problem noted: Mild/Moderate discomfort  Hold (Positioning): Assistance needed to correctly position infant at breast and maintain latch. Intervention(s): Breastfeeding basics reviewed;Support Pillows;Position  options;Skin to skin  LATCH Score: 6  Lactation Tools Discussed/Used Pump Review: Setup, frequency, and cleaning;Milk Storage   Consult Status Consult Status: Follow-up Date: 06/30/15 (9 am) Follow-up type: Out-patient    Debra Mccarty 06/23/2015, 10:54 AM

## 2015-06-23 NOTE — Lactation Note (Signed)
This note was copied from the chart of Debra Kameria Bomba. Lactation Consultation Note  Patient Name: Debra Garrison ColumbusCarolann Mccarty ZOXWR'UToday's Date: 06/23/2015 Reason for consult: Follow-up assessment;Difficult latch;Breast/nipple pain   Follow up with mom prior to D/C. Mom reports she has not been latching infant due to nipple pain. Infant has has 2 BF attempts, 6 bottle feedings of formula of 8-30cc, 3 voids, and 3 stools in last 24 hours. Mom is aware of supplement increases per day of age. She is pumping q 3 hours and is receiving gtts colostrum, encouraged her to hand express post pumping and to feed EBM forst followed by formula supplementation. Mom has a Medela PIS Advanced with her for pumping at home. Mom agreeable to trying to latch infant prior to D/C and [facility] assistance with latching post D/C. Bf information reviewed in Taking Care of Baby and Me. Reviewed information in [facility]. Will follow up at next feeding.    Maternal Data Formula Feeding for Exclusion: No Has patient been taught Hand Expression?: Yes Does the patient have breastfeeding experience prior to this delivery?: No  Feeding Feeding Type: Bottle Fed - Formula Nipple Type: Slow - flow  LATCH Score/Interventions                      Lactation Tools Discussed/Used Pump Review: Setup, frequency, and cleaning;Milk Storage   Consult Status Consult Status: Follow-up Date: 06/23/15 Follow-up type: Call as needed    Ed BlalockSharon S Charmagne Buhl 06/23/2015, 10:25 AM

## 2015-06-23 NOTE — Discharge Summary (Signed)
OB Discharge Summary  Patient Name: Debra Mccarty DOB: 08-11-1990 MRN: [id number]  Date of admission: 06/19/2015 Delivering MD: Casey Burkitt   Date of discharge: 06/23/2015  Admitting diagnosis: INDUCTION Intrauterine pregnancy: [redacted]w[redacted]d     Secondary diagnosis:Active Problems:   Post term pregnancy, 41 weeks   NSVD (normal spontaneous vaginal delivery)   Second-degree perineal laceration, with delivery   Postpartum hemorrhage  Additional problems: None     Discharge diagnosis: Term Pregnancy Delivered                                                                     Post partum procedures:None  Augmentation: AROM, Pitocin, Cytotec and Foley Balloon  Complications: Hemorrhage>1053mL  Hospital course:  Induction of Labor With Vaginal Delivery   24 y.o. yo G2P1011 at [redacted]w[redacted]d was admitted to the hospital 06/19/2015 for induction of labor.  Indication for induction: Postdates.  Patient had an uncomplicated labor course as follows: Membrane Rupture Time/Date: 6:40 AM ,06/21/2015   Intrapartum Procedures: Episiotomy: None [1]                                         Lacerations:  2nd degree [3]  Patient had delivery of a Viable infant.  Information for the patient's newborn:  Luke, Rigsbee [[id number]]  Delivery Method: Vaginal, Spontaneous Delivery (Filed from Delivery Summary)    06/21/2015  Details of delivery can be found in separate delivery note.  Patient had a routine postpartum course but did have PPH 2/2 uterine atony requiring methergine, rectal cytotec, and carboprost. She denies any difficulty ambulating or with weakness or dizziness. Patient is discharged home No discharge date for patient encounter.    Physical exam  Filed Vitals:   06/22/15 0930 06/22/15 1812 06/22/15 2215 06/23/15 0210  BP: 108/54 122/62 116/71 103/56  Pulse: 84 67 71 68  Temp: 98.5 F (36.9 C) 98 F (36.7 C)    TempSrc:      Resp: 18 16    Height:      Weight:       SpO2: 98%      General: alert, cooperative and no distress Lochia: appropriate Uterine Fundus: firm Incision: N/A DVT Evaluation: No evidence of DVT seen on physical exam. Negative Homan's sign. No significant calf/ankle edema. Labs: Lab Results  Component Value Date   WBC 12.6* 06/22/2015   HGB 10.0* 06/22/2015   HCT 29.9* 06/22/2015   MCV 89.5 06/22/2015   PLT 178 06/22/2015   No flowsheet data found.  Discharge instruction: per After Visit Summary and "Baby and Me Booklet".  After Visit Meds:    Medication List    TAKE these medications        CONCEPT OB 130-92.4-1 MG Caps  Take 1 tablet by mouth daily.     ferrous sulfate 325 (65 FE) MG tablet  Take 1 tablet (325 mg total) by mouth 2 (two) times daily with a meal.     ibuprofen 600 MG tablet  Commonly known as:  ADVIL,MOTRIN  Take 1 tablet (600 mg total) by mouth every 6 (six) hours.  Diet: routine diet  Activity: Advance as tolerated. Pelvic rest for 6 weeks.   Outpatient follow up:6 weeks Follow up Appt: Future Appointments Date Time Provider Department Center  07/24/2015 10:15 AM Tereso NewcomerUgonna A Anyanwu, MD [facility]   Follow up visit: No Follow-up on file.  Postpartum contraception: IUD Mirena  Newborn Data: Live born female  Birth Weight: 8 lb 15 oz (4054 g) APGAR: 8, 9  Baby Feeding: Breast Disposition:home with mother   06/23/2015 Debra HaringHillary M Fitzgerald, MD  [facility], PGY[facility]  OB fellow attestation I have seen and examined this patient and agree with above documentation in the resident's note.   Debra ColumbusCarolann Shaver is a 24 y.o. G2P1011 s/p NSVD.   Pain is well controlled.  Plan for birth control is IUD.  Method of Feeding: Breast  PE:  BP 108/72 mmHg  Pulse 73  Temp(Src) 98.5 F (36.9 C) (Oral)  Resp 18  Ht 5\' 8"  (1.727 m)  Wt 210 lb (95.255 kg)  BMI 31.94 kg/m2  SpO2 98%  LMP 09/05/2014  Breastfeeding? Unknown Gen: well appearing Heart: reg rate Lungs:  normal WOB Fundus firm Ext: soft, no pain, no edema  Recent Labs  06/22/15 0220 06/22/15 0555  HGB 10.3* 10.0*  HCT 30.2* 29.9*   Plan: discharge today - postpartum care discussed - f/u clinic in 6 weeks for postpartum visit   Federico FlakeKimberly Niles Donovon Micheletti, MD 9:51 AM

## 2015-06-23 NOTE — Discharge Instructions (Signed)
Debra Mccarty, congratulations on delivering your baby! Because you did lose blood after delivery, we have prescribed iron supplements for you to take until your follow-up appointment. These can cause constipation, so you may want to take a stool softener or fiber supplement.  Vaginal Delivery, Care After Refer to this sheet in the next few weeks. These discharge instructions provide you with information on caring for yourself after delivery. Your health care provider may also give you specific instructions. Your treatment has been planned according to the most current medical practices available, but problems sometimes occur. Call your health care provider if you have any problems or questions after you go home. HOME CARE INSTRUCTIONS  Take over-the-counter or prescription medicines only as directed by your health care provider or pharmacist.  Do not drink alcohol, especially if you are breastfeeding or taking medicine to relieve pain.  Do not chew or smoke tobacco.  Do not use illegal drugs.  Continue to use good perineal care. Good perineal care includes:  Wiping your perineum from front to back.  Keeping your perineum clean.  Do not use tampons or douche until your health care provider says it is okay.  Shower, wash your hair, and take tub baths as directed by your health care provider.  Wear a well-fitting bra that provides breast support.  Eat healthy foods.  Drink enough fluids to keep your urine clear or pale yellow.  Eat high-fiber foods such as whole grain cereals and breads, brown rice, beans, and fresh fruits and vegetables every day. These foods may help prevent or relieve constipation.  Follow your health care provider's recommendations regarding resumption of activities such as climbing stairs, driving, lifting, exercising, or traveling.  Talk to your health care provider about resuming sexual activities. Resumption of sexual activities is dependent upon your risk of  infection, your rate of healing, and your comfort and desire to resume sexual activity.  Try to have someone help you with your household activities and your newborn for at least a few days after you leave the hospital.  Rest as much as possible. Try to rest or take a nap when your newborn is sleeping.  Increase your activities gradually.  Keep all of your scheduled postpartum appointments. It is very important to keep your scheduled follow-up appointments. At these appointments, your health care provider will be checking to make sure that you are healing physically and emotionally. SEEK MEDICAL CARE IF:   You are passing large clots from your vagina. Save any clots to show your health care provider.  You have a foul smelling discharge from your vagina.  You have trouble urinating.  You are urinating frequently.  You have pain when you urinate.  You have a change in your bowel movements.  You have increasing redness, pain, or swelling near your vaginal incision (episiotomy) or vaginal tear.  You have pus draining from your episiotomy or vaginal tear.  Your episiotomy or vaginal tear is separating.  You have painful, hard, or reddened breasts.  You have a severe headache.  You have blurred vision or see spots.  You feel sad or depressed.  You have thoughts of hurting yourself or your newborn.  You have questions about your care, the care of your newborn, or medicines.  You are dizzy or light-headed.  You have a rash.  You have nausea or vomiting.  You were breastfeeding and have not had a menstrual period within 12 weeks after you stopped breastfeeding.  You are not breastfeeding and  have not had a menstrual period by the 12th week after delivery.  You have a fever. SEEK IMMEDIATE MEDICAL CARE IF:   You have persistent pain.  You have chest pain.  You have shortness of breath.  You faint.  You have leg pain.  You have stomach pain.  Your vaginal  bleeding saturates two or more sanitary pads in 1 hour.   This information is not intended to replace advice given to you by your health care provider. Make sure you discuss any questions you have with your health care provider.   Document Released: 07/22/2000 Document Revised: 04/15/2015 Document Reviewed: 03/21/2012 Elsevier Interactive Patient Education [employer]2016 Elsevier Inc.  Places to have your son circumcised:    [facility] [phone] $480 by 4 wks  [facility] [phone] $244 by 4 wks  [facility] [phone] $175 by 2 wks  [facility] [phone] $250 by 7 days [facility] [phone] $150 by 4 wks  These prices sometimes change but are roughly what you can expect to pay. Please call and confirm pricing.   Circumcision is considered an elective/non-medically necessary procedure. There are many reasons parents decide to have their sons circumsized. During the first year of life circumcised males have a reduced risk of urinary tract infections but after this year the rates between circumcised males and uncircumcised males are the same.  It is safe to have your son circumcised outside of the hospital and the places above perform them regularly.

## 2015-06-30 ENCOUNTER — Ambulatory Visit (HOSPITAL_COMMUNITY): Payer: Medicaid Other

## 2015-07-08 ENCOUNTER — Ambulatory Visit (HOSPITAL_COMMUNITY): Admission: RE | Admit: 2015-07-08 | Payer: Medicaid Other | Source: Ambulatory Visit

## 2015-07-22 ENCOUNTER — Encounter: Payer: Self-pay | Admitting: Obstetrics and Gynecology

## 2015-07-22 ENCOUNTER — Ambulatory Visit (INDEPENDENT_AMBULATORY_CARE_PROVIDER_SITE_OTHER): Payer: Medicaid Other | Admitting: Obstetrics and Gynecology

## 2015-07-22 VITALS — BP 135/71 | HR 58 | Temp 97.8°F | Wt 175.9 lb

## 2015-07-22 DIAGNOSIS — Z30430 Encounter for insertion of intrauterine contraceptive device: Secondary | ICD-10-CM | POA: Diagnosis not present

## 2015-07-22 NOTE — Progress Notes (Signed)
Patient ID: Debra Mccarty, female   DOB: 28-Dec-1990, 24 y.o.   MRN: [id number] Subjective:     Debra ColumbusCarolann Chisum is a 24 y.o. female who presents for a postpartum visit. She is 4 weeks postpartum following a spontaneous vaginal delivery. I have fully reviewed the prenatal and intrapartum course. The delivery was at 41 gestational weeks. Outcome: spontaneous vaginal delivery. Anesthesia: epidural. Postpartum course has been unremarkable. Baby's course has been unremarkable. Baby is feeding by both breast and bottle - Similac Isomil. Bleeding no bleeding. Bowel function is abnormal: constipation. Bladder function is normal. Patient is not sexually active. Contraception method is abstinence. Postpartum depression screening: negative.     Review of Systems Pertinent items are noted in HPI.   Objective:    BP 135/71 mmHg  Pulse 58  Temp(Src) 97.8 F (36.6 C)  Wt 175 lb 14.4 oz (79.788 kg)  Breastfeeding? Yes  General:  alert, cooperative and no distress   Breasts:  inspection negative, no nipple discharge or bleeding, no masses or nodularity palpable  Lungs: clear to auscultation bilaterally  Heart:  regular rate and rhythm  Abdomen: soft, non-tender; bowel sounds normal; no masses,  no organomegaly   Vulva:  normal  Vagina: normal vagina, no discharge, exudate, lesion, or erythema  Cervix:  multiparous appearance  Corpus: normal size, contour, position, consistency, mobility, non-tender  Adnexa:  normal adnexa and no mass, fullness, tenderness  Rectal Exam: Not performed.        Assessment:     Normal postpartum exam. Pap smear not done at today's visit.   Plan:    1. Contraception: IUD  IUD Procedure Note Patient identified, informed consent performed, signed copy in chart, time out was performed.  Urine pregnancy test negative.  Speculum placed in the vagina.  Cervix visualized.  Cleaned with Betadine x 2.  Grasped anteriorly with a single tooth tenaculum.  Uterus sounded to 7  cm.  Mirena IUD placed per manufacturer's recommendations.  Strings trimmed to 3 cm. Tenaculum was removed, good hemostasis noted.  Patient tolerated procedure well.    2. Patient is medically cleared to resume all activities of daily living 3. Follow up in: 4 weeks for IUD check or as needed.

## 2015-07-23 LAB — POCT PREGNANCY, URINE: Preg Test, Ur: NEGATIVE

## 2015-07-24 ENCOUNTER — Ambulatory Visit: Payer: Medicaid Other | Admitting: Obstetrics & Gynecology
